# Patient Record
Sex: Female | Born: 1965 | Race: Black or African American | Hispanic: No | Marital: Single | Smoking: Never smoker
Health system: Southern US, Community
[De-identification: ages and names within clinical notes are randomized; demographics above are authoritative.]

## PROBLEM LIST (undated history)

## (undated) DIAGNOSIS — G43909 Migraine, unspecified, not intractable, without status migrainosus: Secondary | ICD-10-CM

## (undated) DIAGNOSIS — G5600 Carpal tunnel syndrome, unspecified upper limb: Secondary | ICD-10-CM

## (undated) DIAGNOSIS — K219 Gastro-esophageal reflux disease without esophagitis: Secondary | ICD-10-CM

## (undated) HISTORY — PX: ABDOMINAL HYSTERECTOMY: SHX81

## (undated) HISTORY — DX: Carpal tunnel syndrome, unspecified upper limb: G56.00

---

## 2010-08-09 ENCOUNTER — Encounter: Payer: Self-pay | Admitting: Emergency Medicine

## 2010-08-09 ENCOUNTER — Emergency Department (HOSPITAL_COMMUNITY)
Admission: EM | Admit: 2010-08-09 | Discharge: 2010-08-09 | Disposition: A | Discharge status: Home / Self Care | Payer: Self-pay | Attending: Emergency Medicine | Admitting: Emergency Medicine

## 2010-08-09 DIAGNOSIS — R21 Rash and other nonspecific skin eruption: Secondary | ICD-10-CM

## 2010-08-09 MED ORDER — TRIAMCINOLONE ACETONIDE 0.1 % EX CREA: TOPICAL_CREAM | Freq: Two times a day (BID) | CUTANEOUS | Status: AC

## 2010-08-09 MED ORDER — PREDNISONE 10 MG PO TABS: ORAL_TABLET | ORAL | Status: DC

## 2010-08-09 MED ORDER — TRIAMCINOLONE ACETONIDE 0.1 % EX CREA: TOPICAL_CREAM | Freq: Two times a day (BID) | CUTANEOUS | Status: DC

## 2010-08-09 NOTE — ED Notes (Signed)
Pt states the rash started around her mouth and now has spread to her neck and back.

## 2010-08-09 NOTE — Discharge Instructions (Signed)
If rash not improving in 5 to 7 days, please see Dr Margo Aye (Dermatologist) for evaluation.Rash-Generic Many things can cause a rash. We are not certain what is causing the rash that you have. Some causes include infection, allergic reactions, medications, and chemicals. Sometimes something in your home that comes in contact with your skin may cause the rash. These include pets, new soaps, cosmetics, and foods. HOME CARE INSTRUCTIONS  Avoid extreme heat or cold, unless otherwise instructed. This can make the itching worse.   A cool bath or shower or a cool washcloth can sometimes ease the itching.   Avoid scratching. This can cause infection.   Take those medications prescribed by your caregiver.  SEEK IMMEDIATE MEDICAL ATTENTION IF:  You develop increasing pain, swelling, or redness.   You develop a fever.   You develop new or severe symptoms such as body aches and pains, diarrhea, vomiting.   Your rash is not better in 3 days.  Document Released: 12/28/2001 Document Re-Released: 04/05/2008 [facility] Patient Information 2011 [place], [place].

## 2010-08-09 NOTE — ED Notes (Signed)
Awaiting pharmacy to bring medication.

## 2010-08-09 NOTE — ED Provider Notes (Signed)
History     Chief Complaint  Patient presents with  . Rash  . Headache   Patient is a 45 y.o. female presenting with rash. The history is provided by the patient.  Rash  This is a new problem. The current episode started more than 2 days ago. The problem has been gradually worsening. The problem is associated with nothing. There has been no fever. The rash is present on the face and neck. The patient is experiencing no pain. Pertinent negatives include no blisters and no weeping. She has tried nothing for the symptoms.    History reviewed. No pertinent past medical history.  History reviewed. No pertinent past surgical history.  Family History  Problem Relation Age of Onset  . Diabetes Mother     History  Substance Use Topics  . Smoking status: Never Smoker   . Smokeless tobacco: Not on file  . Alcohol Use: No    OB History    Grav Para Term Preterm Abortions TAB SAB Ect Mult Living   2 2 2       2       Review of Systems  Constitutional: Negative for activity change.       All ROS Neg except as noted in HPI  HENT: Negative for nosebleeds and neck pain.   Eyes: Negative for photophobia and discharge.  Respiratory: Negative for cough, shortness of breath and wheezing.   Cardiovascular: Negative for chest pain and palpitations.  Gastrointestinal: Negative for abdominal pain and blood in stool.  Genitourinary: Negative for dysuria, frequency and hematuria.  Musculoskeletal: Negative for back pain and arthralgias.  Skin: Positive for rash.       Fine macular rash around the scalp line, mouth, chin, left neck and left shoulder. No oral or palm involvement.  Neurological: Negative for dizziness, seizures and speech difficulty.  Psychiatric/Behavioral: Negative for hallucinations and confusion.    Physical Exam  BP 108/73  Pulse 64  Temp(Src) 98.7 F (37.1 C) (Oral)  Resp 21  Ht 5\' 6"  (1.676 m)  Wt 160 lb (72.576 kg)  BMI 25.82 kg/m2  SpO2 100%  Physical Exam    Nursing note and vitals reviewed. Constitutional: She is oriented to person, place, and time. She appears well-developed and well-nourished.  Non-toxic appearance.  HENT:  Head: Normocephalic.  Right Ear: Tympanic membrane and external ear normal.  Left Ear: Tympanic membrane and external ear normal.  Eyes: EOM and lids are normal. Pupils are equal, round, and reactive to light.  Neck: Normal range of motion. Neck supple. Carotid bruit is not present.  Cardiovascular: Normal rate, regular rhythm, normal heart sounds, intact distal pulses and normal pulses.   Pulmonary/Chest: Breath sounds normal. No respiratory distress.  Abdominal: Soft. Bowel sounds are normal. There is no tenderness. There is no guarding.  Musculoskeletal: Normal range of motion.  Lymphadenopathy:       Head (right side): No submandibular adenopathy present.       Head (left side): No submandibular adenopathy present.    She has no cervical adenopathy.  Neurological: She is alert and oriented to person, place, and time. She has normal strength. No cranial nerve deficit or sensory deficit.  Skin: Skin is warm and dry. Rash noted.       Fine macular rash of the forehead at the scalp line, around the mouth, chin, left neck and shoulder.  Psychiatric: She has a normal mood and affect. Her speech is normal.    ED Course  Procedures  MDM I have reviewed nursing notes, vital signs, and all appropriate lab and imaging results for this patient.      Kathie Dike, [place] 08/09/10 1053

## 2010-08-17 DIAGNOSIS — R079 Chest pain, unspecified: Secondary | ICD-10-CM

## 2010-09-12 NOTE — ED Provider Notes (Signed)
Medical screening examination/treatment/procedure(s) were performed by non-physician practitioner and as supervising physician I was immediately available for consultation/collaboration.   Benny Lennert, MD 09/12/10 [id number]

## 2011-08-09 ENCOUNTER — Encounter (HOSPITAL_COMMUNITY): Payer: Self-pay | Admitting: Emergency Medicine

## 2011-08-09 ENCOUNTER — Emergency Department (HOSPITAL_COMMUNITY)
Admission: EM | Admit: 2011-08-09 | Discharge: 2011-08-09 | Disposition: A | Discharge status: Home / Self Care | Payer: Self-pay | Attending: Emergency Medicine | Admitting: Emergency Medicine

## 2011-08-09 DIAGNOSIS — M542 Cervicalgia: Secondary | ICD-10-CM | POA: Insufficient documentation

## 2011-08-09 HISTORY — DX: Migraine, unspecified, not intractable, without status migrainosus: G43.909

## 2011-08-09 MED ORDER — HYDROCODONE-ACETAMINOPHEN 5-325 MG PO TABS: 1.0000 | ORAL_TABLET | Freq: Four times a day (QID) | ORAL | Status: AC | PRN

## 2011-08-09 MED ORDER — NAPROXEN 500 MG PO TABS: 500.0000 mg | ORAL_TABLET | Freq: Two times a day (BID) | ORAL | Status: DC

## 2011-08-09 MED ORDER — CYCLOBENZAPRINE HCL 10 MG PO TABS: 10.0000 mg | ORAL_TABLET | Freq: Two times a day (BID) | ORAL | Status: AC | PRN

## 2011-08-09 NOTE — ED Notes (Signed)
Patient complaining of sharp pains going down both arms, states that pain shoots through back across shoulders. Also reports numbness and pain that goes down to both hands. States has been bothering her for a few days.

## 2011-08-09 NOTE — ED Notes (Signed)
Patient also reports that she has been getting intermittent pains in her chest. No chest pain at this time, only shoulder pain.

## 2011-08-09 NOTE — ED Provider Notes (Addendum)
History  This chart was scribed for Sara Jakes, MD by Greer Ee. This patient was seen in room APA14/APA14 and the patient's care was started at 7:07AM.  CSN: [id number]  Arrival date & time 08/09/11  7829   First MD Initiated Contact with Patient 08/09/11 [id number]      Chief Complaint  Patient presents with  . Arm Pain     Patient is a 46 y.o. female presenting with arm pain. The history is provided by the patient. No language interpreter was used.  Arm Pain Pertinent negatives include no chest pain, no abdominal pain and no shortness of breath.   Sara Washington is a 46 y.o. female who presents to the Emergency Department complaining of one week of bilateral arm pain described as sharp and worse on the right side.  Pt reports that pain radiates shooting pains to neck and both shoulders with associated numbness in bilateral arms. Pt denies numbness in bilateral hands and fingers and denies any lower extremity symptoms.  Pt reports that current pain as sharp, waxing/waning and as a 5 or 6 out of 10.  Pt has taken Aleve to reduce pain.  She also c/o intermittent CP. She denies CP currently. She has a h/o migraine. She does not have a h/o chronic medical conditions.     Past Medical History  Diagnosis Date  . Migraine     Past Surgical History  Procedure Date  . Cesarean section   . Abdominal hysterectomy     Family History  Problem Relation Age of Onset  . Diabetes Mother     History  Substance Use Topics  . Smoking status: Never Smoker   . Smokeless tobacco: Not on file  . Alcohol Use: No    OB History    Grav Para Term Preterm Abortions TAB SAB Ect Mult Living   2 2 2       2       Review of Systems  Constitutional: Negative for fever and chills.  HENT: Negative for congestion, sore throat and neck pain.   Eyes: Negative for visual disturbance.  Respiratory: Negative for cough and shortness of breath.   Cardiovascular: Negative for chest pain.    Gastrointestinal: Negative for nausea, vomiting, abdominal pain and diarrhea.  Genitourinary: Negative for dysuria.  Musculoskeletal: Negative for back pain.       Bilateral arm and shoulder pain  Skin: Negative for rash.  Neurological: Positive for numbness (Bilateral arms, no numbness is fingers or hands.).  Hematological: Does not bruise/bleed easily.  Psychiatric/Behavioral: Negative for confusion.    Allergies  Review of patient's allergies indicates no known allergies.  Home Medications   Current Outpatient Rx  Name Route Sig Dispense Refill  . CYCLOBENZAPRINE HCL 10 MG PO TABS Oral Take 1 tablet (10 mg total) by mouth 2 (two) times daily as needed for muscle spasms. 20 tablet 0  . HYDROCODONE-ACETAMINOPHEN 5-325 MG PO TABS Oral Take 1-2 tablets by mouth every 6 (six) hours as needed for pain. 10 tablet 0  . NAPROXEN 500 MG PO TABS Oral Take 1 tablet (500 mg total) by mouth 2 (two) times daily. 14 tablet 0  . PREDNISONE 10 MG PO TABS  6,5,4,3,2,1 - take with food 21 tablet 0  . TRIAMCINOLONE ACETONIDE 0.1 % EX CREA Topical Apply topically 2 (two) times daily. 15 g 0    Triage Vitals: BP 137/78  Pulse 75  Temp 98.2 F (36.8 C) (Oral)  Resp 18  Ht 5'  6" (1.676 m)  Wt 160 lb (72.576 kg)  BMI 25.82 kg/m2  SpO2 97%  Physical Exam  Nursing note and vitals reviewed. Constitutional: She is oriented to person, place, and time. She appears well-developed and well-nourished. No distress.  HENT:  Head: Normocephalic and atraumatic.  Mouth/Throat: Oropharynx is clear and moist.  Eyes: Conjunctivae and EOM are normal.  Neck: Neck supple. No tracheal deviation present.  Cardiovascular: Normal rate, regular rhythm and normal heart sounds.   No murmur heard.      Radial pulse 1+ in right arm.  Pulmonary/Chest: Effort normal and breath sounds normal. No respiratory distress.  Abdominal: Soft. Bowel sounds are normal. There is no tenderness.  Musculoskeletal: Normal range of  motion. She exhibits no edema.  Lymphadenopathy:    She has no cervical adenopathy.  Neurological: She is alert and oriented to person, place, and time.  Skin: Skin is warm and dry.  Psychiatric: She has a normal mood and affect. Her behavior is normal.    ED Course  Procedures (including critical care time)  DIAGNOSTIC STUDIES: Oxygen Saturation is 97% on room air, adequate by my interpretation.    COORDINATION OF CARE: AM-Discussed treatment plan with pt at bedside and pt agreed to plan.  Date: 08/09/2011  Rate: 72  Rhythm: normal sinus rhythm  QRS Axis: normal  Intervals: normal  ST/T Wave abnormalities: nonspecific ST changes  Conduction Disutrbances:none  Narrative Interpretation:   Old EKG Reviewed: none available    Labs Reviewed - No data to display No results found.   1. Neck pain       MDM  Symptoms most consistent with cervical neck pain no clearcut radiculopathy at this point in time but does have pain radiating into the right arm predominately but no numbness to the fingers or weakness to the hand. Have referred patient to the spine specialist for followup of treat with anti-inflammatories muscle relaxers and pain medicine and a work note. No history of injury.      I personally performed the services described in this documentation, which was scribed in my presence. The recorded information has been reviewed and considered.     Sara Jakes, MD 08/09/11 [id number] a  Sara Jakes, MD 08/13/11 2203

## 2012-05-28 ENCOUNTER — Emergency Department (HOSPITAL_COMMUNITY)
Admission: EM | Admit: 2012-05-28 | Discharge: 2012-05-28 | Disposition: A | Discharge status: Home / Self Care | Payer: Self-pay | Attending: Emergency Medicine | Admitting: Emergency Medicine

## 2012-05-28 ENCOUNTER — Encounter (HOSPITAL_COMMUNITY): Payer: Self-pay | Admitting: Emergency Medicine

## 2012-05-28 DIAGNOSIS — L03039 Cellulitis of unspecified toe: Secondary | ICD-10-CM | POA: Insufficient documentation

## 2012-05-28 DIAGNOSIS — L03031 Cellulitis of right toe: Secondary | ICD-10-CM

## 2012-05-28 DIAGNOSIS — L02619 Cutaneous abscess of unspecified foot: Secondary | ICD-10-CM | POA: Insufficient documentation

## 2012-05-28 DIAGNOSIS — Z8679 Personal history of other diseases of the circulatory system: Secondary | ICD-10-CM | POA: Insufficient documentation

## 2012-05-28 MED ORDER — OXYCODONE-ACETAMINOPHEN 5-325 MG PO TABS: 1.0000 | ORAL_TABLET | ORAL | Status: DC | PRN

## 2012-05-28 MED ORDER — CEPHALEXIN 500 MG PO CAPS
500.0000 mg | ORAL_CAPSULE | Freq: Once | ORAL | Status: AC
  Administered 2012-05-28: 500 mg via ORAL
  Filled 2012-05-28: qty 1

## 2012-05-28 MED ORDER — CEPHALEXIN 500 MG PO CAPS: 500.0000 mg | ORAL_CAPSULE | Freq: Four times a day (QID) | ORAL | Status: DC

## 2012-05-28 NOTE — ED Notes (Signed)
Pt c/o rt great toe pain x one week.

## 2012-05-28 NOTE — ED Notes (Signed)
Pt states noticed a bump on her left great toe. Pt states putting callus remover on it but has continued to swell & hurting. Medial side of toe covered w/ white callus removed.

## 2012-05-28 NOTE — ED Provider Notes (Signed)
History     CSN: [id number]  Arrival date & time 05/28/12  1905   First MD Initiated Contact with Patient 05/28/12 2047      Chief Complaint  Patient presents with  . Foot Pain  . Wound Check    (Consider location/radiation/quality/duration/timing/severity/associated sxs/prior treatment) Patient is a 47 y.o. female presenting with lower extremity pain and wound check. The history is provided by the patient.  Foot Pain  Wound Check  She noted onset about 4 days ago of pain in her right first toe. She applied some white cream to it and the underlying skin started to turn white. The toe continues to be very painful. She related it to wearing steel toe shoes but states she does wear 2 pairs socks. Pain is moderate to severe and she rates it an 8/10. It is worse with palpation worse with walking. Denies fever, chills, sweats. She has noted some swelling of the toe. She's been taking over-the-counter naproxen without relief the  Past Medical History  Diagnosis Date  . Migraine     Past Surgical History  Procedure Laterality Date  . Cesarean section    . Abdominal hysterectomy      Family History  Problem Relation Age of Onset  . Diabetes Mother     History  Substance Use Topics  . Smoking status: Never Smoker   . Smokeless tobacco: Not on file  . Alcohol Use: No    OB History   Grav Para Term Preterm Abortions TAB SAB Ect Mult Living   2 2 2       2       Review of Systems  All other systems reviewed and are negative.    Allergies  Review of patient's allergies indicates no known allergies.  Home Medications   Current Outpatient Rx  Name  Route  Sig  Dispense  Refill  . Salicylic Acid (CALLUS REMOVERS) 40 % PADS   Apply externally   Apply 1 application topically as needed (for removal).         . Salicylic Acid (CORN/CALLUS REMOVER) 12.6 % SOLN   Apply externally   Apply 1 application topically as needed (for removal).           BP 126/78  Pulse  87  Temp(Src) 97.1 F (36.2 C) (Oral)  Resp 16  Ht 5\' 6"  (1.676 m)  Wt 150 lb (68.04 kg)  BMI 24.22 kg/m2  SpO2 97%  Physical Exam  Nursing note and vitals reviewed.  47 year old female, resting comfortably and in no acute distress. Vital signs are normal. Oxygen saturation is 97%, which is normal. Head is normocephalic and atraumatic. PERRLA, EOMI. Oropharynx is clear. Neck is nontender and supple without adenopathy or JVD. Back is nontender and there is no CVA tenderness. Lungs are clear without rales, wheezes, or rhonchi. Chest is nontender. Heart has regular rate and rhythm without murmur. Abdomen is soft, flat, nontender without masses or hepatosplenomegaly and peristalsis is normoactive. Extremities:. There is a blow on the medial aspect of the right first toe with surrounding erythema. There is no evidence of any purulence in the boil. There no lymphangitic streaks present. This is moderately tender to palpation. Overall picture is that of a low-grade cellulitis. Skin is warm and dry without rash. Neurologic: Mental status is normal, cranial nerves are intact, there are no motor or sensory deficits.  ED Course  Procedures (including critical care time)   1. Cellulitis of great toe of right foot  MDM  Of the right first toe with surrounding cellulitis. She is sent home with prescriptions for cephalexin and Percocet. She is to continue taking naproxen.        Dione Booze, MD 05/28/12 2113

## 2012-06-01 MED FILL — Oxycodone w/ Acetaminophen Tab 5-325 MG: ORAL | Qty: 6 | Status: AC

## 2013-10-04 ENCOUNTER — Emergency Department (HOSPITAL_COMMUNITY)
Admission: EM | Admit: 2013-10-04 | Discharge: 2013-10-04 | Disposition: A | Discharge status: Home / Self Care | Payer: Self-pay | Attending: Emergency Medicine | Admitting: Emergency Medicine

## 2013-10-04 ENCOUNTER — Encounter (HOSPITAL_COMMUNITY): Payer: Self-pay | Admitting: Emergency Medicine

## 2013-10-04 ENCOUNTER — Emergency Department (HOSPITAL_COMMUNITY): Payer: Self-pay

## 2013-10-04 DIAGNOSIS — Z8679 Personal history of other diseases of the circulatory system: Secondary | ICD-10-CM | POA: Insufficient documentation

## 2013-10-04 DIAGNOSIS — N10 Acute tubulo-interstitial nephritis: Secondary | ICD-10-CM | POA: Insufficient documentation

## 2013-10-04 DIAGNOSIS — R1032 Left lower quadrant pain: Secondary | ICD-10-CM | POA: Insufficient documentation

## 2013-10-04 DIAGNOSIS — Z90710 Acquired absence of both cervix and uterus: Secondary | ICD-10-CM | POA: Insufficient documentation

## 2013-10-04 DIAGNOSIS — Z9889 Other specified postprocedural states: Secondary | ICD-10-CM | POA: Insufficient documentation

## 2013-10-04 LAB — COMPREHENSIVE METABOLIC PANEL
ALT: 11 U/L (ref 0–35)
AST: 17 U/L (ref 0–37)
Albumin: 4.4 g/dL (ref 3.5–5.2)
Alkaline Phosphatase: 75 U/L (ref 39–117)
Anion gap: 11 (ref 5–15)
BILIRUBIN TOTAL: 0.5 mg/dL (ref 0.3–1.2)
BUN: 20 mg/dL (ref 6–23)
CHLORIDE: 104 meq/L (ref 96–112)
CO2: 27 meq/L (ref 19–32)
CREATININE: 0.96 mg/dL (ref 0.50–1.10)
Calcium: 9.1 mg/dL (ref 8.4–10.5)
GFR calc Af Amer: 79 mL/min — ABNORMAL LOW (ref >90)
GFR, EST NON AFRICAN AMERICAN: 68 mL/min — AB (ref >90)
Glucose, Bld: 91 mg/dL (ref 70–99)
Potassium: 4 mEq/L (ref 3.7–5.3)
Sodium: 142 mEq/L (ref 137–147)
Total Protein: 7.1 g/dL (ref 6.0–8.3)

## 2013-10-04 LAB — CBC WITH DIFFERENTIAL/PLATELET
BASOS ABS: 0 10*3/uL (ref 0.0–0.1)
Basophils Relative: 0 % (ref 0–1)
Eosinophils Absolute: 0.1 10*3/uL (ref 0.0–0.7)
Eosinophils Relative: 2 % (ref 0–5)
HEMATOCRIT: 37.4 % (ref 36.0–46.0)
HEMOGLOBIN: 12.1 g/dL (ref 12.0–15.0)
LYMPHS PCT: 44 % (ref 12–46)
Lymphs Abs: 2 10*3/uL (ref 0.7–4.0)
MCH: 27.7 pg (ref 26.0–34.0)
MCHC: 32.4 g/dL (ref 30.0–36.0)
MCV: 85.6 fL (ref 78.0–100.0)
MONO ABS: 0.3 10*3/uL (ref 0.1–1.0)
MONOS PCT: 6 % (ref 3–12)
NEUTROS ABS: 2.2 10*3/uL (ref 1.7–7.7)
Neutrophils Relative %: 48 % (ref 43–77)
Platelets: 269 10*3/uL (ref 150–400)
RBC: 4.37 MIL/uL (ref 3.87–5.11)
RDW: 12 % (ref 11.5–15.5)
WBC: 4.6 10*3/uL (ref 4.0–10.5)

## 2013-10-04 LAB — URINALYSIS, ROUTINE W REFLEX MICROSCOPIC
Bilirubin Urine: NEGATIVE
GLUCOSE, UA: NEGATIVE mg/dL
KETONES UR: NEGATIVE mg/dL
LEUKOCYTES UA: NEGATIVE
Nitrite: NEGATIVE
PROTEIN: NEGATIVE mg/dL
Specific Gravity, Urine: 1.025 (ref 1.005–1.030)
Urobilinogen, UA: 2 mg/dL — ABNORMAL HIGH (ref 0.0–1.0)
pH: 5.5 (ref 5.0–8.0)

## 2013-10-04 LAB — URINE MICROSCOPIC-ADD ON

## 2013-10-04 MED ORDER — CEPHALEXIN 500 MG PO CAPS: ORAL_CAPSULE | ORAL | Status: DC

## 2013-10-04 MED ORDER — FENTANYL CITRATE 0.05 MG/ML IJ SOLN
50.0000 ug | Freq: Once | INTRAMUSCULAR | Status: AC
  Administered 2013-10-04: 50 ug via INTRAVENOUS
  Filled 2013-10-04: qty 2

## 2013-10-04 MED ORDER — ONDANSETRON 8 MG PO TBDP: ORAL_TABLET | ORAL | Status: DC

## 2013-10-04 MED ORDER — OXYCODONE-ACETAMINOPHEN 5-325 MG PO TABS: 2.0000 | ORAL_TABLET | Freq: Four times a day (QID) | ORAL | Status: DC | PRN

## 2013-10-04 NOTE — Discharge Instructions (Signed)
Pyelonephritis, Adult Pyelonephritis is a kidney infection. A kidney infection can happen quickly, or it can last for a long time. HOME CARE   Take your medicine (antibiotics) as told. Finish it even if you start to feel better.  Keep all doctor visits as told.  Drink enough fluids to keep your pee (urine) clear or pale yellow.  Only take medicine as told by your doctor. GET HELP RIGHT AWAY IF:   You have a fever or lasting symptoms for more than 2-3 days.  You have a fever and your symptoms suddenly get worse.  You cannot take your medicine or drink fluids as told.  You have chills and shaking.  You feel very weak or pass out (faint).  You do not feel better after 2 days or have other concerns. MAKE SURE YOU:  Understand these instructions.  Will watch your condition.  Will get help right away if you are not doing well or get worse. Document Released: 02/15/2004 Document Revised: 07/09/2011 Document Reviewed: 06/27/2010 [facility] Patient Information 2015 [place], [place]. This information is not intended to replace advice given to you by your health care provider. Make sure you discuss any questions you have with your health care provider.

## 2013-10-04 NOTE — ED Provider Notes (Signed)
CSN: [id number]     Arrival date & time 10/04/13  1454 History   First MD Initiated Contact with Patient 10/04/13 1604     This chart was scribed for Sara Relic, MD by Sara Washington, ED Scribe. This patient was seen in room APA19/APA19 and the patient's care was started at 4:04 PM.   Chief Complaint  Patient presents with  . Flank Pain   The history is provided by the patient. No language interpreter was used.    HPI Comments: Sara Washington is a 48 y.o. female with a prior of C-section and hysterectomy who presents to the Emergency Department complaining of sharp left abdominal and flank pain with onset a few weeks ago. She states it has been intermittent for a few hours at a time for the past few weeks but states it has been constant today. She states she sometimes feel as if she cannot get comfortable and other times feels as if she can't move. She denies history of kidney stones or diverticulitis. She denies weakness or numbness in legs, pain radiating down legs, or change to bowel or bladder function  Past Medical History  Diagnosis Date  . Migraine    Past Surgical History  Procedure Laterality Date  . Cesarean section    . Abdominal hysterectomy     Family History  Problem Relation Age of Onset  . Diabetes Mother    History  Substance Use Topics  . Smoking status: Never Smoker   . Smokeless tobacco: Not on file  . Alcohol Use: No   OB History   Grav Para Term Preterm Abortions TAB SAB Ect Mult Living   2 2 2       2      Review of Systems 10 Systems reviewed and are negative for acute change except as noted in the HPI.  Allergies  Review of patient's allergies indicates no known allergies.  Home Medications   Prior to Admission medications   Medication Sig Start Date End Date Taking? Authorizing Provider  cephALEXin (KEFLEX) 500 MG capsule 2 caps po bid x 7 days 10/04/13   Sara Relic, MD  ondansetron (ZOFRAN ODT) 8 MG disintegrating tablet 8mg  ODT q4 hours prn  nausea 10/04/13   Sara Relic, MD  oxyCODONE-acetaminophen (PERCOCET) 5-325 MG per tablet Take 2 tablets by mouth every 6 (six) hours as needed for severe pain. 10/04/13   Sara Relic, MD   BP 114/81  Pulse 67  Temp(Src) 99.4 F (37.4 C) (Oral)  Resp 20  Ht 5\' 5"  (1.651 m)  Wt 150 lb (68.04 kg)  BMI 24.96 kg/m2  SpO2 99% Physical Exam  Nursing note and vitals reviewed. Constitutional:  Awake, alert, nontoxic appearance.  HENT:  Head: Atraumatic.  Eyes: Right eye exhibits no discharge. Left eye exhibits no discharge.  Neck: Neck supple.  Pulmonary/Chest: Effort normal. She exhibits no tenderness.  Abdominal: Soft. Bowel sounds are normal. There is tenderness (mild left lower tenderness, rest nont\ender). There is no rebound.  Genitourinary:  No CVA tenderness, positive mild left paralumbar tenderness without midline tenderness without rash  Musculoskeletal: She exhibits no tenderness.  Baseline ROM, no obvious new focal weakness.  Neurological:  Mental status and motor strength appears baseline for patient and situation.  Skin: No rash noted.  Psychiatric: She has a normal mood and affect.    ED Course  Procedures (including critical care time) Patient informed of clinical course, understand medical decision-making process, and agree with plan. Labs  Review Labs Reviewed  COMPREHENSIVE METABOLIC PANEL - Abnormal; Notable for the following:    GFR calc non Af Amer 68 (*)    GFR calc Af Amer 79 (*)    All other components within normal limits  URINALYSIS, ROUTINE W REFLEX MICROSCOPIC - Abnormal; Notable for the following:    Hgb urine dipstick SMALL (*)    Urobilinogen, UA 2.0 (*)    All other components within normal limits  URINE MICROSCOPIC-ADD ON - Abnormal; Notable for the following:    Squamous Epithelial / LPF FEW (*)    Bacteria, UA FEW (*)    All other components within normal limits  URINE CULTURE  CBC WITH DIFFERENTIAL    Imaging Review No results  found.   EKG Interpretation None     Patient / Family / Caregiver understand and agree with initial ED impression and plan with expectations set for ED visit.  6:49 PM Pt feels improved after observation and/or treatment in ED.   MDM   Final diagnoses:  Pyelonephritis, acute   I doubt any other EMC precluding discharge at this time including, but not necessarily limited to the following:diverticulitis, renal stone.   I personally performed the services described in this documentation, which was scribed in my presence. The recorded information has been reviewed and is accurate.      Sara Relic, MD 10/06/13 [phone]

## 2013-10-04 NOTE — ED Notes (Signed)
Lt flank pain intermittent for 2 weeks., cramping in legs.

## 2013-10-06 LAB — URINE CULTURE
COLONY COUNT: NO GROWTH
CULTURE: NO GROWTH

## 2013-11-22 ENCOUNTER — Encounter (HOSPITAL_COMMUNITY): Payer: Self-pay | Admitting: Emergency Medicine

## 2013-12-06 ENCOUNTER — Encounter (HOSPITAL_COMMUNITY): Payer: Self-pay

## 2013-12-06 ENCOUNTER — Emergency Department (HOSPITAL_COMMUNITY): Payer: Self-pay

## 2013-12-06 ENCOUNTER — Emergency Department (HOSPITAL_COMMUNITY)
Admission: EM | Admit: 2013-12-06 | Discharge: 2013-12-06 | Disposition: A | Discharge status: Home / Self Care | Payer: Self-pay | Attending: Emergency Medicine | Admitting: Emergency Medicine

## 2013-12-06 DIAGNOSIS — R200 Anesthesia of skin: Secondary | ICD-10-CM | POA: Insufficient documentation

## 2013-12-06 DIAGNOSIS — R0789 Other chest pain: Secondary | ICD-10-CM | POA: Insufficient documentation

## 2013-12-06 DIAGNOSIS — H53149 Visual discomfort, unspecified: Secondary | ICD-10-CM | POA: Insufficient documentation

## 2013-12-06 DIAGNOSIS — G43909 Migraine, unspecified, not intractable, without status migrainosus: Secondary | ICD-10-CM | POA: Insufficient documentation

## 2013-12-06 DIAGNOSIS — R51 Headache: Secondary | ICD-10-CM

## 2013-12-06 DIAGNOSIS — R519 Headache, unspecified: Secondary | ICD-10-CM

## 2013-12-06 DIAGNOSIS — Z79891 Long term (current) use of opiate analgesic: Secondary | ICD-10-CM | POA: Insufficient documentation

## 2013-12-06 LAB — CBC
HCT: 36.8 % (ref 36.0–46.0)
Hemoglobin: 11.8 g/dL — ABNORMAL LOW (ref 12.0–15.0)
MCH: 27.7 pg (ref 26.0–34.0)
MCHC: 32.1 g/dL (ref 30.0–36.0)
MCV: 86.4 fL (ref 78.0–100.0)
PLATELETS: 246 10*3/uL (ref 150–400)
RBC: 4.26 MIL/uL (ref 3.87–5.11)
RDW: 12.3 % (ref 11.5–15.5)
WBC: 5 10*3/uL (ref 4.0–10.5)

## 2013-12-06 LAB — BASIC METABOLIC PANEL
ANION GAP: 13 (ref 5–15)
BUN: 15 mg/dL (ref 6–23)
CALCIUM: 9.2 mg/dL (ref 8.4–10.5)
CO2: 26 mEq/L (ref 19–32)
CREATININE: 0.98 mg/dL (ref 0.50–1.10)
Chloride: 105 mEq/L (ref 96–112)
GFR, EST AFRICAN AMERICAN: 77 mL/min — AB (ref >90)
GFR, EST NON AFRICAN AMERICAN: 66 mL/min — AB (ref >90)
Glucose, Bld: 117 mg/dL — ABNORMAL HIGH (ref 70–99)
Potassium: 3.5 mEq/L — ABNORMAL LOW (ref 3.7–5.3)
Sodium: 144 mEq/L (ref 137–147)

## 2013-12-06 LAB — PROTIME-INR
INR: 1.06 (ref 0.00–1.49)
PROTHROMBIN TIME: 13.9 s (ref 11.6–15.2)

## 2013-12-06 LAB — TROPONIN I: Troponin I: <0.3 ng/mL (ref <0.30)

## 2013-12-06 MED ORDER — SODIUM CHLORIDE 0.9 % IV SOLN
1000.0000 mL | Freq: Once | INTRAVENOUS | Status: AC
  Administered 2013-12-06: 1000 mL via INTRAVENOUS

## 2013-12-06 MED ORDER — METOCLOPRAMIDE HCL 10 MG PO TABS: 10.0000 mg | ORAL_TABLET | Freq: Four times a day (QID) | ORAL | Status: DC | PRN

## 2013-12-06 MED ORDER — SODIUM CHLORIDE 0.9 % IV SOLN
1000.0000 mL | INTRAVENOUS | Status: DC
  Administered 2013-12-06: 1000 mL via INTRAVENOUS

## 2013-12-06 MED ORDER — KETOROLAC TROMETHAMINE 30 MG/ML IJ SOLN
30.0000 mg | Freq: Once | INTRAMUSCULAR | Status: AC
  Administered 2013-12-06: 30 mg via INTRAVENOUS
  Filled 2013-12-06: qty 1

## 2013-12-06 MED ORDER — DEXAMETHASONE SODIUM PHOSPHATE 10 MG/ML IJ SOLN
10.0000 mg | Freq: Once | INTRAMUSCULAR | Status: AC
  Administered 2013-12-06: 10 mg via INTRAVENOUS
  Filled 2013-12-06: qty 1

## 2013-12-06 MED ORDER — DIPHENHYDRAMINE HCL 50 MG/ML IJ SOLN
25.0000 mg | Freq: Once | INTRAMUSCULAR | Status: AC
  Administered 2013-12-06: 25 mg via INTRAVENOUS
  Filled 2013-12-06: qty 1

## 2013-12-06 MED ORDER — METOCLOPRAMIDE HCL 5 MG/ML IJ SOLN
10.0000 mg | Freq: Once | INTRAMUSCULAR | Status: AC
  Administered 2013-12-06: 10 mg via INTRAVENOUS
  Filled 2013-12-06: qty 2

## 2013-12-06 NOTE — ED Provider Notes (Signed)
CSN: [id number]     Arrival date & time 12/06/13  0119 History   First MD Initiated Contact with Patient 12/06/13 0136     Chief Complaint  Patient presents with  . Chest Pain  . Headache     (Consider location/radiation/quality/duration/timing/severity/associated sxs/prior Treatment) Patient is a 48 y.o. female presenting with chest pain and headaches. The history is provided by the patient.  Chest Pain Associated symptoms: headache   Headache She history separate complaints. She has a left frontal headache which started at 10 AM. This headache is a throbbing pain which she states is typical for her migraines. She rates pain at 9/10. There is associated photophobia. She denies visual disturbance, neck stiffness, fever, weakness, numbness, tingling. She took over-the-counter naproxen with no relief. Her second complaint is right-sided chest pain which she describes as a sharp pain which is worse with movement. Pain comes and goes and will last about 30-40 minutes when present. There is some associated numbness of the right arm. She denies dyspnea, nausea, diaphoresis. She denies any trauma. There's been no fever or chills. She is not taken anything for this pain. She rates pain at 9/10 when present.  Past Medical History  Diagnosis Date  . Migraine    Past Surgical History  Procedure Laterality Date  . Cesarean section    . Abdominal hysterectomy     Family History  Problem Relation Age of Onset  . Diabetes Mother    History  Substance Use Topics  . Smoking status: Never Smoker   . Smokeless tobacco: Not on file  . Alcohol Use: No   OB History    Gravida Para Term Preterm AB TAB SAB Ectopic Multiple Living   2 2 2       2      Review of Systems  Cardiovascular: Positive for chest pain.  Neurological: Positive for headaches.  All other systems reviewed and are negative.     Allergies  Review of patient's allergies indicates no known allergies.  Home Medications    Prior to Admission medications   Medication Sig Start Date End Date Taking? Authorizing Provider  cephALEXin (KEFLEX) 500 MG capsule 2 caps po bid x 7 days 10/04/13   Babette Relic, MD  ondansetron (ZOFRAN ODT) 8 MG disintegrating tablet 8mg  ODT q4 hours prn nausea 10/04/13   Babette Relic, MD  oxyCODONE-acetaminophen (PERCOCET) 5-325 MG per tablet Take 2 tablets by mouth every 6 (six) hours as needed for severe pain. 10/04/13   Babette Relic, MD   BP 122/82 mmHg  Pulse 77  Temp(Src) 97.9 F (36.6 C) (Oral)  Resp 16  Ht 5\' 6"  (1.676 m)  Wt 150 lb (68.04 kg)  BMI 24.22 kg/m2  SpO2 99% Physical Exam  Nursing note and vitals reviewed.  48 year old female, resting comfortably and in no acute distress. Vital signs are normal. Oxygen saturation is 99%, which is normal. Head is normocephalic and atraumatic. PERRLA, EOMI. Oropharynx is clear. Fundi show no hemorrhage, exudate, or papilledema. Neck is nontender and supple without adenopathy or JVD. Back is nontender and there is no CVA tenderness. Lungs are clear without rales, wheezes, or rhonchi. Chest is moderately tender over the right upper anterior chest wall. This does reproduce her pain. Heart has regular rate and rhythm without murmur. Abdomen is soft, flat, nontender without masses or hepatosplenomegaly and peristalsis is normoactive. Extremities have no cyanosis or edema, full range of motion is present. Skin is warm and dry without  rash. Neurologic: Mental status is normal, cranial nerves are intact, there are no motor or sensory deficits.  ED Course  Procedures (including critical care time) Labs Review Results for orders placed or performed during the hospital encounter of 12/06/13  CBC  Result Value Ref Range   WBC 5.0 4.0 - 10.5 K/uL   RBC 4.26 3.87 - 5.11 MIL/uL   Hemoglobin 11.8 (L) 12.0 - 15.0 g/dL   HCT 36.8 36.0 - 46.0 %   MCV 86.4 78.0 - 100.0 fL   MCH 27.7 26.0 - 34.0 pg   MCHC 32.1 30.0 - 36.0 g/dL   RDW  12.3 11.5 - 15.5 %   Platelets 246 150 - 400 K/uL  Basic metabolic panel  Result Value Ref Range   Sodium 144 137 - 147 mEq/L   Potassium 3.5 (L) 3.7 - 5.3 mEq/L   Chloride 105 96 - 112 mEq/L   CO2 26 19 - 32 mEq/L   Glucose, Bld 117 (H) 70 - 99 mg/dL   BUN 15 6 - 23 mg/dL   Creatinine, Ser 0.98 0.50 - 1.10 mg/dL   Calcium 9.2 8.4 - 10.5 mg/dL   GFR calc non Af Amer 66 (L) >90 mL/min   GFR calc Af Amer 77 (L) >90 mL/min   Anion gap 13 5 - 15  Troponin I (MHP)  Result Value Ref Range   Troponin I <0.30 <0.30 ng/mL  Protime-INR (if pt is taking Coumadin)  Result Value Ref Range   Prothrombin Time 13.9 11.6 - 15.2 seconds   INR 1.06 0.00 - 1.49   Imaging Review Dg Chest Port 1 View  12/06/2013   CLINICAL DATA:  Headache with LEFT chest pain for 3 days.  EXAM: PORTABLE CHEST - 1 VIEW  COMPARISON:  None.  FINDINGS: The heart size and mediastinal contours are within normal limits. Both lungs are clear. The visualized skeletal structures are unremarkable.  IMPRESSION: No active disease.   Electronically Signed   By: Elon Alas   On: 12/06/2013 02:07     EKG Interpretation   Date/Time:  Monday December 06 2013 01:32:59 EST Ventricular Rate:  77 PR Interval:  176 QRS Duration: 96 QT Interval:  399 QTC Calculation: 452 R Axis:   36 Text Interpretation:  Sinus rhythm Normal ECG When compared with ECG of  08/09/2011, No significant change was found Confirmed by [facility]  MD, Gonsalo Cuthbertson  ([id number]) on 12/06/2013 2:01:03 AM      MDM   Final diagnoses:  Headache, unspecified headache type  Musculoskeletal chest pain    Headache which is certainly consistent with migraine headache. This will be treated with IV fluids,-Hydramine, metoclopramide, and dexamethasone. Chest pain which seems to be chest wall pain. She will be given ketorolac for this. Initial workup is negative including normal chest x-ray and ECG and negative troponin.  She had excellent relief of symptoms with above  noted treatment. She is discharged with prescription for metoclopramide to use for nausea or headache, and is told to use over-the-counter naproxen for her chest wall pain.  Delora Fuel, MD 41/66/06 [id number]

## 2013-12-06 NOTE — ED Notes (Signed)
Pt c/o stinging in right leg with administration of medication. Notified EDP. Will monitor for now per EDP as this is a possible side effect of meditation and benadryl has been administered.

## 2013-12-06 NOTE — ED Notes (Signed)
Discharge instructions given, pt demonstrated teach back and verbal understanding. No concerns voiced.  

## 2013-12-06 NOTE — ED Notes (Signed)
Pt reports intermittent numbness and pain to the right side of her chest for over a week.  Pt also c/o headache

## 2013-12-06 NOTE — Discharge Instructions (Signed)
Take two naproxen tablets at a time, twice a day.  Migraine Headache A migraine headache is an intense, throbbing pain on one or both sides of your head. A migraine can last for 30 minutes to several hours. CAUSES  The exact cause of a migraine headache is not always known. However, a migraine may be caused when nerves in the brain become irritated and release chemicals that cause inflammation. This causes pain. Certain things may also trigger migraines, such as:  Alcohol.  Smoking.  Stress.  Menstruation.  Aged cheeses.  Foods or drinks that contain nitrates, glutamate, aspartame, or tyramine.  Lack of sleep.  Chocolate.  Caffeine.  Hunger.  Physical exertion.  Fatigue.  Medicines used to treat chest pain (nitroglycerine), birth control pills, estrogen, and some blood pressure medicines. SIGNS AND SYMPTOMS  Pain on one or both sides of your head.  Pulsating or throbbing pain.  Severe pain that prevents daily activities.  Pain that is aggravated by any physical activity.  Nausea, vomiting, or both.  Dizziness.  Pain with exposure to bright lights, loud noises, or activity.  General sensitivity to bright lights, loud noises, or smells. Before you get a migraine, you may get warning signs that a migraine is coming (aura). An aura may include:  Seeing flashing lights.  Seeing bright spots, halos, or zigzag lines.  Having tunnel vision or blurred vision.  Having feelings of numbness or tingling.  Having trouble talking.  Having muscle weakness. DIAGNOSIS  A migraine headache is often diagnosed based on:  Symptoms.  Physical exam.  A CT scan or MRI of your head. These imaging tests cannot diagnose migraines, but they can help rule out other causes of headaches. TREATMENT Medicines may be given for pain and nausea. Medicines can also be given to help prevent recurrent migraines.  HOME CARE INSTRUCTIONS  Only take over-the-counter or prescription  medicines for pain or discomfort as directed by your health care provider. The use of long-term narcotics is not recommended.  Lie down in a dark, quiet room when you have a migraine.  Keep a journal to find out what may trigger your migraine headaches. For example, write down:  What you eat and drink.  How much sleep you get.  Any change to your diet or medicines.  Limit alcohol consumption.  Quit smoking if you smoke.  Get 7-9 hours of sleep, or as recommended by your health care provider.  Limit stress.  Keep lights dim if bright lights bother you and make your migraines worse. SEEK IMMEDIATE MEDICAL CARE IF:   Your migraine becomes severe.  You have a fever.  You have a stiff neck.  You have vision loss.  You have muscular weakness or loss of muscle control.  You start losing your balance or have trouble walking.  You feel faint or pass out.  You have severe symptoms that are different from your first symptoms. MAKE SURE YOU:   Understand these instructions.  Will watch your condition.  Will get help right away if you are not doing well or get worse. Document Released: 01/07/2005 Document Revised: 05/24/2013 Document Reviewed: 09/14/2012 [facility] Patient Information 2015 [place], [place]. This information is not intended to replace advice given to you by your health care provider. Make sure you discuss any questions you have with your health care provider.  Chest Wall Pain Chest wall pain is pain in or around the bones and muscles of your chest. It may take up to 6 weeks to get better.  It may take longer if you must stay physically active in your work and activities.  CAUSES  Chest wall pain may happen on its own. However, it may be caused by:  A viral illness like the flu.  Injury.  Coughing.  Exercise.  Arthritis.  Fibromyalgia.  Shingles. HOME CARE INSTRUCTIONS   Avoid overtiring physical activity. Try not to strain or perform activities that  cause pain. This includes any activities using your chest or your abdominal and side muscles, especially if heavy weights are used.  Put ice on the sore area.  Put ice in a plastic bag.  Place a towel between your skin and the bag.  Leave the ice on for 15-20 minutes per hour while awake for the first 2 days.  Only take over-the-counter or prescription medicines for pain, discomfort, or fever as directed by your caregiver. SEEK IMMEDIATE MEDICAL CARE IF:   Your pain increases, or you are very uncomfortable.  You have a fever.  Your chest pain becomes worse.  You have new, unexplained symptoms.  You have nausea or vomiting.  You feel sweaty or lightheaded.  You have a cough with phlegm (sputum), or you cough up blood. MAKE SURE YOU:   Understand these instructions.  Will watch your condition.  Will get help right away if you are not doing well or get worse. Document Released: 01/07/2005 Document Revised: 04/01/2011 Document Reviewed: 09/03/2010 [facility] Patient Information 2015 [place], [place]. This information is not intended to replace advice given to you by your health care provider. Make sure you discuss any questions you have with your health care provider.  Metoclopramide tablets What is this medicine? METOCLOPRAMIDE (met oh kloe PRA mide) is used to treat the symptoms of gastroesophageal reflux disease (GERD) like heartburn. It is also used to treat people with slow emptying of the stomach and intestinal tract. This medicine may be used for other purposes; ask your health care provider or pharmacist if you have questions. COMMON BRAND NAME(S): Reglan What should I tell my health care provider before I take this medicine? They need to know if you have any of these conditions: -breast cancer -depression -diabetes -heart failure -high blood pressure -kidney disease -liver disease -Parkinson's disease or a movement disorder -pheochromocytoma -seizures -stomach  obstruction, bleeding, or perforation -an unusual or allergic reaction to metoclopramide, procainamide, sulfites, other medicines, foods, dyes, or preservatives -pregnant or trying to get pregnant -breast-feeding How should I use this medicine? Take this medicine by mouth with a glass of water. Follow the directions on the prescription label. Take this medicine on an empty stomach, about 30 minutes before eating. Take your doses at regular intervals. Do not take your medicine more often than directed. Do not stop taking except on the advice of your doctor or health care professional. A special MedGuide will be given to you by the pharmacist with each prescription and refill. Be sure to read this information carefully each time. Talk to your pediatrician regarding the use of this medicine in children. Special care may be needed. Overdosage: If you think you have taken too much of this medicine contact a poison control center or emergency room at once. NOTE: This medicine is only for you. Do not share this medicine with others. What if I miss a dose? If you miss a dose, take it as soon as you can. If it is almost time for your next dose, take only that dose. Do not take double or extra doses. What may interact with  this medicine? -acetaminophen -cyclosporine -digoxin -medicines for blood pressure -medicines for diabetes, including insulin -medicines for hay fever and other allergies -medicines for depression, especially an Monoamine Oxidase Inhibitor (MAOI) -medicines for Parkinson's disease, like levodopa -medicines for sleep or for pain -tetracycline This list may not describe all possible interactions. Give your health care provider a list of all the medicines, herbs, non-prescription drugs, or dietary supplements you use. Also tell them if you smoke, drink alcohol, or use illegal drugs. Some items may interact with your medicine. What should I watch for while using this medicine? It may  take a few weeks for your stomach condition to start to get better. However, do not take this medicine for longer than 12 weeks. The longer you take this medicine, and the more you take it, the greater your chances are of developing serious side effects. If you are an elderly patient, a female patient, or you have diabetes, you may be at an increased risk for side effects from this medicine. Contact your doctor immediately if you start having movements you cannot control such as lip smacking, rapid movements of the tongue, involuntary or uncontrollable movements of the eyes, head, arms and legs, or muscle twitches and spasms. Patients and their families should watch out for worsening depression or thoughts of suicide. Also watch out for any sudden or severe changes in feelings such as feeling anxious, agitated, panicky, irritable, hostile, aggressive, impulsive, severely restless, overly excited and hyperactive, or not being able to sleep. If this happens, especially at the beginning of treatment or after a change in dose, call your doctor. Do not treat yourself for high fever. Ask your doctor or health care professional for advice. You may get drowsy or dizzy. Do not drive, use machinery, or do anything that needs mental alertness until you know how this drug affects you. Do not stand or sit up quickly, especially if you are an older patient. This reduces the risk of dizzy or fainting spells. Alcohol can make you more drowsy and dizzy. Avoid alcoholic drinks. What side effects may I notice from receiving this medicine? Side effects that you should report to your doctor or health care professional as soon as possible: -allergic reactions like skin rash, itching or hives, swelling of the face, lips, or tongue -abnormal production of milk in females -breast enlargement in both males and females -change in the way you walk -difficulty moving, speaking or swallowing -drooling, lip smacking, or rapid movements  of the tongue -excessive sweating -fever -involuntary or uncontrollable movements of the eyes, head, arms and legs -irregular heartbeat or palpitations -muscle twitches and spasms -unusually weak or tired Side effects that usually do not require medical attention (report to your doctor or health care professional if they continue or are bothersome): -change in sex drive or performance -depressed mood -diarrhea -difficulty sleeping -headache -menstrual changes -restless or nervous This list may not describe all possible side effects. Call your doctor for medical advice about side effects. You may report side effects to FDA at 1-800-FDA-1088. Where should I keep my medicine? Keep out of the reach of children. Store at room temperature between 20 and 25 degrees C (68 and 77 degrees F). Protect from light. Keep container tightly closed. Throw away any unused medicine after the expiration date. NOTE: This sheet is a summary. It may not cover all possible information. If you have questions about this medicine, talk to your doctor, pharmacist, or health care provider.  2015, [employer]. (2011-05-07 13:04:38)

## 2014-03-26 ENCOUNTER — Emergency Department (HOSPITAL_COMMUNITY)
Admission: EM | Admit: 2014-03-26 | Discharge: 2014-03-26 | Disposition: A | Discharge status: Home / Self Care | Payer: Self-pay | Attending: Emergency Medicine | Admitting: Emergency Medicine

## 2014-03-26 ENCOUNTER — Encounter (HOSPITAL_COMMUNITY): Payer: Self-pay | Admitting: Emergency Medicine

## 2014-03-26 DIAGNOSIS — J029 Acute pharyngitis, unspecified: Secondary | ICD-10-CM | POA: Insufficient documentation

## 2014-03-26 DIAGNOSIS — R0981 Nasal congestion: Secondary | ICD-10-CM

## 2014-03-26 DIAGNOSIS — H66002 Acute suppurative otitis media without spontaneous rupture of ear drum, left ear: Secondary | ICD-10-CM

## 2014-03-26 DIAGNOSIS — Z792 Long term (current) use of antibiotics: Secondary | ICD-10-CM | POA: Insufficient documentation

## 2014-03-26 DIAGNOSIS — J3489 Other specified disorders of nose and nasal sinuses: Secondary | ICD-10-CM | POA: Insufficient documentation

## 2014-03-26 DIAGNOSIS — G43909 Migraine, unspecified, not intractable, without status migrainosus: Secondary | ICD-10-CM | POA: Insufficient documentation

## 2014-03-26 MED ORDER — AMOXICILLIN 500 MG PO CAPS: 500.0000 mg | ORAL_CAPSULE | Freq: Three times a day (TID) | ORAL | Status: AC

## 2014-03-26 MED ORDER — CETIRIZINE-PSEUDOEPHEDRINE ER 5-120 MG PO TB12: 1.0000 | ORAL_TABLET | Freq: Two times a day (BID) | ORAL | Status: DC

## 2014-03-26 NOTE — ED Provider Notes (Signed)
CSN: [id number]     Arrival date & time 03/26/14  0822 History   First MD Initiated Contact with Patient 03/26/14 [phone]     Chief Complaint  Patient presents with  . Otalgia     (Consider location/radiation/quality/duration/timing/severity/associated sxs/prior Treatment) The history is provided by the patient.   Sara Washington is a 49 y.o. female presenting with a 2 week history of uri type symptoms which includes nasal and sinus congestion with clear rhinorrhea, mild sore throat, nonproductive cough and now a one week history of left ear pressure and decreased hearing acuity. She reports minimal pain.  Symptoms due to not include  Tinnitus, dizziness, shortness of breath, chest pain,  Nausea, vomiting or diarrhea.  The patient has taken an zyrtec d with no significant improvement in symptoms.      Past Medical History  Diagnosis Date  . Migraine    Past Surgical History  Procedure Laterality Date  . Cesarean section    . Abdominal hysterectomy     Family History  Problem Relation Age of Onset  . Diabetes Mother    History  Substance Use Topics  . Smoking status: Never Smoker   . Smokeless tobacco: Not on file  . Alcohol Use: No   OB History    Gravida Para Term Preterm AB TAB SAB Ectopic Multiple Living   2 2 2       2      Review of Systems  Constitutional: Negative for fever and chills.  HENT: Positive for congestion, ear pain, hearing loss, rhinorrhea, sinus pressure and sore throat. Negative for ear discharge, facial swelling, trouble swallowing and voice change.   Eyes: Negative for discharge.  Respiratory: Positive for cough. Negative for shortness of breath, wheezing and stridor.   Cardiovascular: Negative for chest pain.  Gastrointestinal: Negative for abdominal pain.  Genitourinary: Negative.       Allergies  Review of patient's allergies indicates no known allergies.  Home Medications   Prior to Admission medications   Medication Sig Start Date End  Date Taking? Authorizing Provider  amoxicillin (AMOXIL) 500 MG capsule Take 1 capsule (500 mg total) by mouth 3 (three) times daily. 03/26/14 04/05/14  Evalee Jefferson, PA-C  cephALEXin (KEFLEX) 500 MG capsule 2 caps po bid x 7 days 10/04/13   Babette Relic, MD  cetirizine-pseudoephedrine (ZYRTEC-D) 5-120 MG per tablet Take 1 tablet by mouth 2 (two) times daily. 03/26/14   Evalee Jefferson, PA-C  metoCLOPramide (REGLAN) 10 MG tablet Take 1 tablet (10 mg total) by mouth every 6 (six) hours as needed for nausea (or headache). 11/91/47   Delora Fuel, MD  ondansetron (ZOFRAN ODT) 8 MG disintegrating tablet 8mg  ODT q4 hours prn nausea 10/04/13   Babette Relic, MD  oxyCODONE-acetaminophen (PERCOCET) 5-325 MG per tablet Take 2 tablets by mouth every 6 (six) hours as needed for severe pain. 10/04/13   Babette Relic, MD   BP 110/91 mmHg  Pulse 84  Temp(Src) 97.8 F (36.6 C) (Oral)  Resp 18  Ht 5\' 6"  (1.676 m)  Wt 160 lb (72.576 kg)  BMI 25.84 kg/m2  SpO2 95% Physical Exam  Constitutional: She is oriented to person, place, and time. She appears well-developed and well-nourished.  HENT:  Head: Normocephalic and atraumatic.  Right Ear: Tympanic membrane and ear canal normal.  Left Ear: Ear canal normal. No drainage. Tympanic membrane is erythematous and bulging. Decreased hearing is noted.  Nose: Mucosal edema and rhinorrhea present.  Mouth/Throat: Uvula is midline, oropharynx  is clear and moist and mucous membranes are normal. No oropharyngeal exudate, posterior oropharyngeal edema, posterior oropharyngeal erythema or tonsillar abscesses.  Eyes: Conjunctivae are normal.  Cardiovascular: Normal rate and normal heart sounds.   Pulmonary/Chest: Effort normal. No respiratory distress. She has no wheezes. She has no rales.  Abdominal: Soft. There is no tenderness.  Musculoskeletal: Normal range of motion.  Neurological: She is alert and oriented to person, place, and time.  Skin: Skin is warm and dry. No rash noted.   Psychiatric: She has a normal mood and affect.    ED Course  Procedures (including critical care time) Labs Review Labs Reviewed - No data to display  Imaging Review No results found.   EKG Interpretation None      MDM   Final diagnoses:  Acute suppurative otitis media of left ear without spontaneous rupture of tympanic membrane, recurrence not specified  Nasal congestion with rhinorrhea    Pt placed on amoxil, advised she should continue zyrtec d, menthol drops, vicks, steam tx to try to reduce congestion.    The patient appears reasonably screened and/or stabilized for discharge and I doubt any other medical condition or other [facility] requiring further screening, evaluation, or treatment in the ED at this time prior to discharge.     Evalee Jefferson, PA-C 03/26/14 1814  Veryl Speak, MD 03/27/14 [phone]

## 2014-03-26 NOTE — Discharge Instructions (Signed)
Otitis Media Otitis media is redness, soreness, and inflammation of the middle ear. Otitis media may be caused by allergies or, most commonly, by infection. Often it occurs as a complication of the common cold. SIGNS AND SYMPTOMS Symptoms of otitis media may include:  Earache.  Fever.  Ringing in your ear.  Headache.  Leakage of fluid from the ear. DIAGNOSIS To diagnose otitis media, your health care provider will examine your ear with an otoscope. This is an instrument that allows your health care provider to see into your ear in order to examine your eardrum. Your health care provider also will ask you questions about your symptoms. TREATMENT  Typically, otitis media resolves on its own within 3-5 days. Your health care provider may prescribe medicine to ease your symptoms of pain. If otitis media does not resolve within 5 days or is recurrent, your health care provider may prescribe antibiotic medicines if he or she suspects that a bacterial infection is the cause. HOME CARE INSTRUCTIONS   If you were prescribed an antibiotic medicine, finish it all even if you start to feel better.  Take medicines only as directed by your health care provider.  Keep all follow-up visits as directed by your health care provider. SEEK MEDICAL CARE IF:  You have otitis media only in one ear, or bleeding from your nose, or both.  You notice a lump on your neck.  You are not getting better in 3-5 days.  You feel worse instead of better. SEEK IMMEDIATE MEDICAL CARE IF:   You have pain that is not controlled with medicine.  You have swelling, redness, or pain around your ear or stiffness in your neck.  You notice that part of your face is paralyzed.  You notice that the bone behind your ear (mastoid) is tender when you touch it. MAKE SURE YOU:   Understand these instructions.  Will watch your condition.  Will get help right away if you are not doing well or get worse. Document Released:  10/13/2003 Document Revised: 05/24/2013 Document Reviewed: 08/04/2012 [facility] Patient Information 2015 [place], [place]. This information is not intended to replace advice given to you by your health care provider. Make sure you discuss any questions you have with your health care provider.    Emergency Department Resource Guide 1) Find a Doctor and Pay Out of Pocket Although you won't have to find out who is covered by your insurance plan, it is a good idea to ask around and get recommendations. You will then need to call the office and see if the doctor you have chosen will accept you as a new patient and what types of options they offer for patients who are self-pay. Some doctors offer discounts or will set up payment plans for their patients who do not have insurance, but you will need to ask so you aren't surprised when you get to your appointment.  2) Contact Your Local Health Department Not all health departments have doctors that can see patients for sick visits, but many do, so it is worth a call to see if yours does. If you don't know where your local health department is, you can check in your phone book. The CDC also has a tool to help you locate your state's health department, and many state websites also have listings of all of their local health departments.  3) Find a [facility] If your illness is not likely to be very severe or complicated, you may want to try a  walk in clinic. These are popping up all over the country in pharmacies, drugstores, and shopping centers. They're usually staffed by nurse practitioners or physician assistants that have been trained to treat common illnesses and complaints. They're usually fairly quick and inexpensive. However, if you have serious medical issues or chronic medical problems, these are probably not your best option.  No Primary Care Doctor: - Call Health Connect at  [phone] - they can help you locate a primary care doctor that  accepts  your insurance, provides certain services, etc. - Physician Referral Service- [phone]  Chronic Pain Problems: Organization         Address  Phone   Notes  [facility]  [phone] Patients need to be referred by their primary care doctor.   Medication Assistance: Organization         Address  Phone   Notes  [facility] Medication [facility] [address] [place], [place] [address] [phone] --Must be a resident of [facility] -- Must have NO insurance coverage whatsoever (no Medicaid/ Medicare, etc.) -- The pt. MUST have a primary care doctor that directs their care regularly and follows them in the community   MedAssist  [phone]   [employer]  [phone]    Agencies that provide inexpensive medical care: Organization         Address  Phone   Notes  [place]  [phone]   [facility]    [phone]   [facility] [place], [place] [address] [phone]   [place] [address] [place] [phone]   Planned Parenthood    [phone]   [facility]    [phone]   [place] [address], [place] Phone:  [phone], Fax:  [phone] Hours of Operation:  9 am - 6 pm, M-F.  Also accepts Medicaid/Medicare and self-pay.  [facility] for [place], [address], [place] Phone: [phone], Fax: [phone]. Hours of Operation:  8:30 am - 5:30 pm, M-F.  Also accepts Medicaid and self-pay.  [facility] [place] [address] [place] Phone: [phone]   [place], [place], [place] [phone], Ext. [phone] Mondays & Thursdays: 7-9 AM.  First 15 patients are seen on a first come, first serve basis.    [place] Providers:  Organization          Address  Phone   Notes  [facility] [address], [place] [phone] Also accepts self-pay patients.  [facility] [address], [place]  [phone]   [place], [address], [place] [phone]   [facility] [address] [place] [phone]   Lucianne Lei [address], [place]   [phone] Only accepts [place] Access [place] patients after they have their name applied to their card.   Self-Pay (no insurance) in [facility]:  Organization         Address  Phone   Notes  Sickle Cell Patients, [facility] [place] [phone]   [facility] [place] [phone]   Gershon Mussel  [facility] [place]  Mobile, [address], [place] [phone]   [facility]/Dr. Osei-Bonsu  [address], [place] or [address], [place] [phone] Phone number for both [place] and [place] locations is the same.  [facility] [address] [place] [phone]   [facility] [address] [place] or [address] [phone]   [facility] [address] [place] [phone], phone; [phone], fax Sees patients 1st and 3rd Saturday of every month.  Must not qualify for public or private insurance (i.e. Medicaid, Medicare, [place] Health Choice, Veterans' Benefits)  Household income should be no more than 200% of the poverty level The clinic cannot treat you if you are pregnant or think you are pregnant  Sexually transmitted diseases are not treated at the clinic.    Dental Care: Organization         Address  Phone  Notes  [facility] [place] [phone] Accepts children up to age 72 who are enrolled in [place] or [place]; pregnant women with a Medicaid card; and children who have applied for Medicaid or [place] Health Choice, but were declined, whose parents can pay a reduced fee at time of service.  [facility] Department of [facility]  [address], [place] [phone] Accepts children up to age 58 who are enrolled in [place] or [place]; pregnant women with a Medicaid card; and children who have applied for Medicaid or [place] Health Choice, but were declined, whose parents can pay a reduced fee at time of service.  [place] Adult [facility] Access PROGRAM  [place] [phone] Patients are seen by appointment only. Walk-ins are not accepted. [place] will see patients 33 years of age and older. Monday - Tuesday (8am-5pm) Most Wednesdays (8:30-5pm) $30 per visit, cash only  [facility] Adult Dental Access PROGRAM  [address], [facility] [phone] Patients are seen by appointment only. Walk-ins are not accepted. [place] will see patients 14 years of age and older. One Wednesday Evening (Monthly: Volunteer Based).  $30 per visit, cash only  [place]  [phone] for adults; Children under age 55, call Graduate Pediatric Dentistry at [phone]. Children aged 73-14, please call [phone] to request a pediatric application.  Dental services are provided in all areas of dental care including fillings, crowns and bridges, complete and partial dentures, implants, gum treatment, root canals, and extractions. Preventive care is also provided. Treatment is provided to both adults and children. Patients are selected via a lottery and there is often a waiting list.   [facility] [address], [place]  [phone] [email or web address]   [facility] [address] [place], [place]  [phone], Ext. [phone] Second and Fourth Thursday of each month, opens at 6:30 AM; Clinic ends at 9 AM.  Patients are seen on a first-come first-served basis, and a limited number are seen during each clinic.   [facility]  [address] Hill[place] Danker [place], [place] [phone]   Eligibility Requirements You must have lived in [place], [place], or [place] counties for at least the last three months.   You cannot be eligible for state or federal sponsored healthcare  insurance, including [employer], [place], or [employer].   You generally cannot be eligible for healthcare insurance through your employer.    How to apply: Eligibility screenings are held every Tuesday and Wednesday afternoon from 1:00 pm until 4:00 pm. You do not need an appointment for the interview!  [facility] [address] [place], [place]   [place]  [place] Department  Park  [phone]    Behavioral Health Resources in the Community: Intensive Outpatient Programs Organization         Address  Phone  Notes  [place], [place], [place] [phone]   [facility] Outpatient [address] [place], [place]   ADS: [facility] [address] [place], [place]   [place] [address],  [place], [place] or [phone]   Substance Abuse Resources Organization         Address  Phone  Notes  Alcohol and Drug Services  [phone]   [place]  [phone]   [facility] [place]   Chinita Pester  [phone]   Residential & Outpatient Substance Abuse Program  [phone]   Psychological Services Organization         Address  Phone  Notes  [facility]  [place]  [phone]    [place] [address] [place] or [phone]    Mobile Crisis Teams Organization         Address  Phone  Notes  [facility], Mobile Crisis Care Unit  [phone]   [facility]  [address] [place], [place]   [place] [address], [place] [phone]    Self-Help/Support Groups Organization         Address  Phone             Notes  [place]. of [place] - variety of support groups  [place] Call for more information  Narcotics Anonymous (NA), [facility] [address], [employer] [place]  2 meetings at this location   Special educational needs teacher         Address  Phone  Notes  [facility]idential Treatment [place],    [place]  [phone]   [facility]  [address], [place] 623762, [place], [place]   [place], [place] [phone] Admissions: 8am-3pm M-F  Incentives Substance [place] [address]    [place], [place] [phone]   [facility] [address] [place], [place], [place]   [facility] [address],  [place], [place]   [facility] Programs - Intensive Outpatient [address], [place], [place]   [facility] ([place]  [place], [place] [phone] or [phone]   [facility] Services (RTS) [address], [place], [place] Accepts Medicaid  [facility] [place] [address],  [place] [phone] Substance Abuse/Addiction Treatment   [place] Organization         Address  Phone  Notes  [facility]  [phone]   Domenic Schwab, PhD [address] [place], [place]   [phone] or [phone])  [place]   [place], [place] [phone]   [facility] [address], [place], [place] [phone] Insurance/Medicaid/sponsorship through [facility] and Families [address], Ste [place], [place] [phone] [place], [place] [phone]    Dr. Adele Schilder  [phone]   [facility] [place] Dept. 1) [address] [place] 2) [place] 3)  [place] [address], [place] [phone]  [phone]   [place] [phone] or [phone] (After Hours)      Take your entire course of the antibiotic.  Other suggestions to help resolve your nasal congestion include menthol cough drops, vicks vapor inhaler stick, steamy bathroom.

## 2014-03-26 NOTE — ED Notes (Signed)
PT c/o sinus pressure and drainage x2 weeks ago and left ear pressure and decreased hearing x1 week.

## 2014-05-27 ENCOUNTER — Emergency Department (HOSPITAL_COMMUNITY)
Admission: EM | Admit: 2014-05-27 | Discharge: 2014-05-27 | Disposition: A | Discharge status: Home / Self Care | Payer: Self-pay | Attending: Emergency Medicine | Admitting: Emergency Medicine

## 2014-05-27 ENCOUNTER — Encounter (HOSPITAL_COMMUNITY): Payer: Self-pay | Admitting: Emergency Medicine

## 2014-05-27 DIAGNOSIS — M778 Other enthesopathies, not elsewhere classified: Secondary | ICD-10-CM | POA: Insufficient documentation

## 2014-05-27 DIAGNOSIS — IMO0002 Reserved for concepts with insufficient information to code with codable children: Secondary | ICD-10-CM

## 2014-05-27 DIAGNOSIS — Z79899 Other long term (current) drug therapy: Secondary | ICD-10-CM | POA: Insufficient documentation

## 2014-05-27 DIAGNOSIS — G43909 Migraine, unspecified, not intractable, without status migrainosus: Secondary | ICD-10-CM | POA: Insufficient documentation

## 2014-05-27 MED ORDER — IBUPROFEN 400 MG PO TABS
600.0000 mg | ORAL_TABLET | Freq: Once | ORAL | Status: AC
  Administered 2014-05-27: 600 mg via ORAL
  Filled 2014-05-27: qty 2

## 2014-05-27 MED ORDER — NAPROXEN 500 MG PO TABS: 500.0000 mg | ORAL_TABLET | Freq: Two times a day (BID) | ORAL | Status: DC

## 2014-05-27 NOTE — ED Provider Notes (Signed)
CSN: [id number]     Arrival date & time 05/27/14  0904 History   First MD Initiated Contact with Patient 05/27/14 [phone]     Chief Complaint  Patient presents with  . Arm Pain     (Consider location/radiation/quality/duration/timing/severity/associated sxs/prior Treatment) Patient is a 49 y.o. female presenting with arm pain. The history is provided by the patient.  Arm Pain This is a new problem. The current episode started in the past 7 days. The problem occurs constantly. The problem has been gradually worsening. Exacerbated by: using right arm. She has tried nothing for the symptoms.  Sara Washington is a 49 y.o. female who presents to the ED with right wrist and forearm pain. She states that over the years she has had carpel tunnel but over the past 4 days the symptoms are much worse. She reports doing repetitive motion at work in [employer] The pain starts in the wrist and radiates to the the elbow.  She denies any other problems. She has taken nothing for pain.   Past Medical History  Diagnosis Date  . Migraine    Past Surgical History  Procedure Laterality Date  . Cesarean section    . Abdominal hysterectomy     Family History  Problem Relation Age of Onset  . Diabetes Mother    History  Substance Use Topics  . Smoking status: Never Smoker   . Smokeless tobacco: Not on file  . Alcohol Use: No   OB History    Gravida Para Term Preterm AB TAB SAB Ectopic Multiple Living   2 2 2       2      Review of Systems Negative except as stated in HPI   Allergies  Review of patient's allergies indicates no known allergies.  Home Medications   Prior to Admission medications   Medication Sig Start Date End Date Taking? Authorizing Provider  cephALEXin (KEFLEX) 500 MG capsule 2 caps po bid x 7 days 10/04/13   Riki Altes, MD  cetirizine-pseudoephedrine (ZYRTEC-D) 5-120 MG per tablet Take 1 tablet by mouth 2 (two) times daily. 03/26/14   Evalee Jefferson, PA-C  metoCLOPramide  (REGLAN) 10 MG tablet Take 1 tablet (10 mg total) by mouth every 6 (six) hours as needed for nausea (or headache). 46/65/99   Delora Fuel, MD  naproxen (NAPROSYN) 500 MG tablet Take 1 tablet (500 mg total) by mouth 2 (two) times daily. 05/27/14   [place] Bunnie Pion, NP  ondansetron (ZOFRAN ODT) 8 MG disintegrating tablet 8mg  ODT q4 hours prn nausea 10/04/13   Riki Altes, MD  oxyCODONE-acetaminophen (PERCOCET) 5-325 MG per tablet Take 2 tablets by mouth every 6 (six) hours as needed for severe pain. 10/04/13   Riki Altes, MD   BP 113/66 mmHg  Pulse 72  Temp(Src) 98 F (36.7 C) (Oral)  Resp 20  Ht 5\' 6"  (1.676 m)  Wt 150 lb (68.04 kg)  BMI 24.22 kg/m2  SpO2 100% Physical Exam  Constitutional: She is oriented to person, place, and time. She appears well-developed and well-nourished. No distress.  HENT:  Head: Normocephalic.  Eyes: EOM are normal.  Neck: Neck supple.  Cardiovascular: Normal rate.   Pulmonary/Chest: Effort normal.  Musculoskeletal: Normal range of motion.       Right wrist: She exhibits tenderness. She exhibits no deformity and no laceration. Decreased range of motion: due to pain. Swelling: minimal.       Arms: Tenderness with palpation and range of motion that starts on the  palmar aspect of the right wrist and radiates to the forearm to the elbow. Radial pulses 2+ bilateral, adequate circulation, equal grips, good touch sensation.  Neurological: She is alert and oriented to person, place, and time. No cranial nerve deficit.  Skin: Skin is warm and dry.  Psychiatric: She has a normal mood and affect. Her behavior is normal.  Nursing note and vitals reviewed.   ED Course  Procedures (including critical care time) Wrist/forearm splint, ice NSAIDS, follow up with ortho.   Labs Review Labs Reviewed - No data to display  Imaging Review No results found.   EKG Interpretation None      MDM  49 y.o. female with hx of carpal tunnel with acute right wrist and forearm pain x  4 days. Will treat for tendonitis, wrist splint, sling, ice, NSAIDS and follow up with ortho. Discussed with the patient and all questioned fully answered. She will return here if any problems arise.   Final diagnoses:  Tendonitis of elbow or forearm       Ashley Murrain, NP 05/27/14 [place], MD 05/28/14 [phone]

## 2014-05-27 NOTE — Discharge Instructions (Signed)
Wear the splint for comfort. Use the sling for the next couple days, apply ice to the area. Follow up with ortho if the symptoms do not improve.

## 2014-05-27 NOTE — ED Notes (Signed)
Pt from home, states that she has had right wrist pain since her diagnosis of carpel tunnel multiple years ago. This week the pain has been shooting from her wrist down her arm. She does a lot of heavy lifting and repetitive movements at work.

## 2014-05-27 NOTE — ED Notes (Signed)
Gave patient ice pack to place on right wrist.

## 2014-08-05 ENCOUNTER — Emergency Department (HOSPITAL_COMMUNITY)
Admission: EM | Admit: 2014-08-05 | Discharge: 2014-08-05 | Disposition: A | Discharge status: Home / Self Care | Payer: Self-pay | Attending: Emergency Medicine | Admitting: Emergency Medicine

## 2014-08-05 ENCOUNTER — Encounter (HOSPITAL_COMMUNITY): Payer: Self-pay | Admitting: *Deleted

## 2014-08-05 DIAGNOSIS — Y93H2 Activity, gardening and landscaping: Secondary | ICD-10-CM | POA: Insufficient documentation

## 2014-08-05 DIAGNOSIS — Z79899 Other long term (current) drug therapy: Secondary | ICD-10-CM | POA: Insufficient documentation

## 2014-08-05 DIAGNOSIS — Y9289 Other specified places as the place of occurrence of the external cause: Secondary | ICD-10-CM | POA: Insufficient documentation

## 2014-08-05 DIAGNOSIS — Z791 Long term (current) use of non-steroidal anti-inflammatories (NSAID): Secondary | ICD-10-CM | POA: Insufficient documentation

## 2014-08-05 DIAGNOSIS — Y998 Other external cause status: Secondary | ICD-10-CM | POA: Insufficient documentation

## 2014-08-05 DIAGNOSIS — G43909 Migraine, unspecified, not intractable, without status migrainosus: Secondary | ICD-10-CM | POA: Insufficient documentation

## 2014-08-05 DIAGNOSIS — T63441A Toxic effect of venom of bees, accidental (unintentional), initial encounter: Secondary | ICD-10-CM | POA: Insufficient documentation

## 2014-08-05 MED ORDER — PREDNISONE 50 MG PO TABS
60.0000 mg | ORAL_TABLET | Freq: Once | ORAL | Status: AC
  Administered 2014-08-05: 60 mg via ORAL
  Filled 2014-08-05 (×2): qty 1

## 2014-08-05 MED ORDER — PREDNISONE 10 MG PO TABS: ORAL_TABLET | ORAL | Status: DC

## 2014-08-05 MED ORDER — HYDROXYZINE HCL 25 MG PO TABS
25.0000 mg | ORAL_TABLET | Freq: Once | ORAL | Status: AC
  Administered 2014-08-05: 25 mg via ORAL
  Filled 2014-08-05: qty 1

## 2014-08-05 NOTE — ED Notes (Signed)
Bee stung on Wednesday to lower legs and right hip. Pt states itching and soreness to the areas. NAD.

## 2014-08-05 NOTE — ED Provider Notes (Signed)
CSN: [id number]     Arrival date & time 08/05/14  1246 History   First MD Initiated Contact with Patient 08/05/14 1304     Chief Complaint  Patient presents with  . Insect Bite     (Consider location/radiation/quality/duration/timing/severity/associated sxs/prior Treatment) HPI Comments: Patient is a 49 year old female who presents to the emergency department with complaint of multiple bee stings.  The patient states that on Wednesday, July 13 she was mowing when several yellow jackets attacked her, stinging her lower legs, and also on the right buttocks. She states that she had very little problem on the first day after the sting, but now she has a burning sensation, at times pain, and a lot of itching in the areas, particularly at her lower legs. She has not had any difficulty with breathing or swallowing. She's not had any swelling of the face or tongue.  The history is provided by the patient.    Past Medical History  Diagnosis Date  . Migraine    Past Surgical History  Procedure Laterality Date  . Cesarean section    . Abdominal hysterectomy     Family History  Problem Relation Age of Onset  . Diabetes Mother    History  Substance Use Topics  . Smoking status: Never Smoker   . Smokeless tobacco: Not on file  . Alcohol Use: No   OB History    Gravida Para Term Preterm AB TAB SAB Ectopic Multiple Living   2 2 2       2      Review of Systems  Constitutional: Negative for activity change.       All ROS Neg except as noted in HPI  HENT: Negative for nosebleeds.   Eyes: Negative for photophobia and discharge.  Respiratory: Negative for cough, shortness of breath and wheezing.   Cardiovascular: Negative for chest pain and palpitations.  Gastrointestinal: Negative for abdominal pain and blood in stool.  Genitourinary: Negative for dysuria, frequency and hematuria.  Musculoskeletal: Negative for back pain, arthralgias and neck pain.  Skin: Negative.   Neurological:  Positive for headaches. Negative for dizziness, seizures and speech difficulty.  Psychiatric/Behavioral: Negative for hallucinations and confusion.      Allergies  Review of patient's allergies indicates no known allergies.  Home Medications   Prior to Admission medications   Medication Sig Start Date End Date Taking? Authorizing Provider  cephALEXin (KEFLEX) 500 MG capsule 2 caps po bid x 7 days Patient not taking: Reported on 08/05/2014 10/04/13   Riki Altes, MD  cetirizine-pseudoephedrine (ZYRTEC-D) 5-120 MG per tablet Take 1 tablet by mouth 2 (two) times daily. 03/26/14   Evalee Jefferson, PA-C  metoCLOPramide (REGLAN) 10 MG tablet Take 1 tablet (10 mg total) by mouth every 6 (six) hours as needed for nausea (or headache). 63/87/56   Delora Fuel, MD  naproxen (NAPROSYN) 500 MG tablet Take 1 tablet (500 mg total) by mouth 2 (two) times daily. 05/27/14   Hope Bunnie Pion, NP  ondansetron (ZOFRAN ODT) 8 MG disintegrating tablet 8mg  ODT q4 hours prn nausea Patient not taking: Reported on 08/05/2014 10/04/13   Riki Altes, MD  oxyCODONE-acetaminophen (PERCOCET) 5-325 MG per tablet Take 2 tablets by mouth every 6 (six) hours as needed for severe pain. Patient not taking: Reported on 08/05/2014 10/04/13   Riki Altes, MD   BP 124/77 mmHg  Pulse 80  Temp(Src) 98 F (36.7 C) (Oral)  Resp 14  Ht 5\' 5"  (1.651 m)  Wt 150 lb (68.04 kg)  BMI 24.96 kg/m2  SpO2 98% Physical Exam  Constitutional: She is oriented to person, place, and time. She appears well-developed and well-nourished.  Non-toxic appearance.  HENT:  Head: Normocephalic.  Right Ear: Tympanic membrane and external ear normal.  Left Ear: Tympanic membrane and external ear normal.  No facial swelling. Airway is patent. No swelling of the tongue.  Eyes: EOM and lids are normal. Pupils are equal, round, and reactive to light.  Neck: Normal range of motion. Neck supple. Carotid bruit is not present.  Cardiovascular: Normal rate, regular rhythm,  normal heart sounds, intact distal pulses and normal pulses.   Pulmonary/Chest: Breath sounds normal. No respiratory distress. She has no wheezes. She has no rales.  Abdominal: Soft. Bowel sounds are normal. There is no tenderness. There is no guarding.  Musculoskeletal: Normal range of motion.  Full range of motion of the toes and ankles of the right and left lower extremity. Dorsalis pedis and posterior tibial pulses are 2+.  Lymphadenopathy:       Head (right side): No submandibular adenopathy present.       Head (left side): No submandibular adenopathy present.    She has no cervical adenopathy.  Neurological: She is alert and oriented to person, place, and time. She has normal strength. No cranial nerve deficit or sensory deficit.  Skin: Skin is warm and dry.  There is several red raised areas consistent with stings of the right and left lower leg. The areas are not hot. There no red streaks appreciated. There is full range of motion of the toes and ankle.  Psychiatric: She has a normal mood and affect. Her speech is normal.  Nursing note and vitals reviewed.   ED Course  Procedures (including critical care time) Labs Review Labs Reviewed - No data to display  Imaging Review No results found.   EKG Interpretation None      MDM  Patient sustained multiple stings 2-3 days ago. No evidence for any anaphylactic related issues. The vital signs are within normal limits. The patient speaks in complete sentences without any problem whatsoever. The patient is ambulatory without problem.  The patient will be treated with a short course of prednisone, and asked to use Allegra during the day, and may use Benadryl at bedtime if needed.    Final diagnoses:  None    **I have reviewed nursing notes, vital signs, and all appropriate lab and imaging results for this patient.Lily Kocher, PA-C 08/05/14 1359  Ripley Fraise, MD 08/05/14 1540

## 2014-08-05 NOTE — Discharge Instructions (Signed)
Please use Allegra each morning, use Benadryl at bedtime if needed for itching and burning. Please use prednisone taperas prescribed. May return to the emergency department if any emergent changes, problems, or concerns.  Bee, Wasp, or Hornet Sting Your caregiver has diagnosed you as having an insect sting. An insect sting appears as a red lump in the skin that sometimes has a tiny hole in the center, or it may have a stinger in the center of the wound. The most common stings are from wasps, hornets and bees. Individuals have different reactions to insect stings.  A normal reaction may cause pain, swelling, and redness around the sting site.  A localized allergic reaction may cause swelling and redness that extends beyond the sting site.  A large local reaction may continue to develop over the next 12 to 36 hours.  On occasion, the reactions can be severe (anaphylactic reaction). An anaphylactic reaction may cause wheezing; difficulty breathing; chest pain; fainting; raised, itchy, red patches on the skin; a sick feeling to your stomach (nausea); vomiting; cramping; or diarrhea. If you have had an anaphylactic reaction to an insect sting in the past, you are more likely to have one again. HOME CARE INSTRUCTIONS   With bee stings, a small sac of poison is left in the wound. Brushing across this with something such as a credit card, or anything similar, will help remove this and decrease the amount of the reaction. This same procedure will not help a wasp sting as they do not leave behind a stinger and poison sac.  Apply a cold compress for 10 to 20 minutes every hour for 1 to 2 days, depending on severity, to reduce swelling and itching.  To lessen pain, a paste made of water and baking soda may be rubbed on the bite or sting and left on for 5 minutes.  To relieve itching and swelling, you may use take medication or apply medicated creams or lotions as directed.  Only take over-the-counter or  prescription medicines for pain, discomfort, or fever as directed by your caregiver.  Wash the sting site daily with soap and water. Apply antibiotic ointment on the sting site as directed.  If you suffered a severe reaction:  If you did not require hospitalization, an adult will need to stay with you for 24 hours in case the symptoms return.  You may need to wear a medical bracelet or necklace stating the allergy.  You and your family need to learn when and how to use an anaphylaxis kit or epinephrine injection.  If you have had a severe reaction before, always carry your anaphylaxis kit with you. SEEK MEDICAL CARE IF:   None of the above helps within 2 to 3 days.  The area becomes red, warm, tender, and swollen beyond the area of the bite or sting.  You have an oral temperature above 102 F (38.9 C). SEEK IMMEDIATE MEDICAL CARE IF:  You have symptoms of an allergic reaction which are:  Wheezing.  Difficulty breathing.  Chest pain.  Lightheadedness or fainting.  Itchy, raised, red patches on the skin.  Nausea, vomiting, cramping or diarrhea. ANY OF THESE SYMPTOMS MAY REPRESENT A SERIOUS PROBLEM THAT IS AN EMERGENCY. Do not wait to see if the symptoms will go away. Get medical help right away. Call your local emergency services (911 in U.S.). DO NOT drive yourself to the hospital. MAKE SURE YOU:   Understand these instructions.  Will watch your condition.  Will get help right  away if you are not doing well or get worse. Document Released: 01/07/2005 Document Revised: 04/01/2011 Document Reviewed: 06/24/2009 [facility] Patient Information 2015 [place], [place]. This information is not intended to replace advice given to you by your health care provider. Make sure you discuss any questions you have with your health care provider.

## 2015-02-04 ENCOUNTER — Emergency Department (HOSPITAL_COMMUNITY): Payer: Self-pay

## 2015-02-04 ENCOUNTER — Emergency Department (HOSPITAL_COMMUNITY)
Admission: EM | Admit: 2015-02-04 | Discharge: 2015-02-04 | Disposition: A | Discharge status: Home / Self Care | Payer: Self-pay | Attending: Emergency Medicine | Admitting: Emergency Medicine

## 2015-02-04 ENCOUNTER — Encounter (HOSPITAL_COMMUNITY): Payer: Self-pay | Admitting: Emergency Medicine

## 2015-02-04 DIAGNOSIS — N3091 Cystitis, unspecified with hematuria: Secondary | ICD-10-CM | POA: Insufficient documentation

## 2015-02-04 DIAGNOSIS — Z8679 Personal history of other diseases of the circulatory system: Secondary | ICD-10-CM | POA: Insufficient documentation

## 2015-02-04 LAB — CBC WITH DIFFERENTIAL/PLATELET
BASOS ABS: 0 10*3/uL (ref 0.0–0.1)
Basophils Relative: 1 %
EOS ABS: 0.1 10*3/uL (ref 0.0–0.7)
EOS PCT: 1 %
HCT: 40.6 % (ref 36.0–46.0)
Hemoglobin: 12.8 g/dL (ref 12.0–15.0)
LYMPHS PCT: 46 %
Lymphs Abs: 1.9 10*3/uL (ref 0.7–4.0)
MCH: 27.5 pg (ref 26.0–34.0)
MCHC: 31.5 g/dL (ref 30.0–36.0)
MCV: 87.1 fL (ref 78.0–100.0)
MONO ABS: 0.2 10*3/uL (ref 0.1–1.0)
Monocytes Relative: 5 %
Neutro Abs: 2 10*3/uL (ref 1.7–7.7)
Neutrophils Relative %: 47 %
PLATELETS: 263 10*3/uL (ref 150–400)
RBC: 4.66 MIL/uL (ref 3.87–5.11)
RDW: 12.2 % (ref 11.5–15.5)
WBC: 4.1 10*3/uL (ref 4.0–10.5)

## 2015-02-04 LAB — URINALYSIS, ROUTINE W REFLEX MICROSCOPIC
Bilirubin Urine: NEGATIVE
GLUCOSE, UA: NEGATIVE mg/dL
Ketones, ur: NEGATIVE mg/dL
LEUKOCYTES UA: NEGATIVE
Nitrite: NEGATIVE
Protein, ur: NEGATIVE mg/dL
SPECIFIC GRAVITY, URINE: 1.025 (ref 1.005–1.030)
pH: 5 (ref 5.0–8.0)

## 2015-02-04 LAB — BASIC METABOLIC PANEL
ANION GAP: 8 (ref 5–15)
BUN: 14 mg/dL (ref 6–20)
CALCIUM: 9.6 mg/dL (ref 8.9–10.3)
CO2: 27 mmol/L (ref 22–32)
Chloride: 108 mmol/L (ref 101–111)
Creatinine, Ser: 0.9 mg/dL (ref 0.44–1.00)
GFR calc Af Amer: >60 mL/min (ref >60)
GLUCOSE: 108 mg/dL — AB (ref 65–99)
POTASSIUM: 4 mmol/L (ref 3.5–5.1)
Sodium: 143 mmol/L (ref 135–145)

## 2015-02-04 LAB — URINE MICROSCOPIC-ADD ON

## 2015-02-04 MED ORDER — IOHEXOL 300 MG/ML  SOLN
25.0000 mL | Freq: Once | INTRAMUSCULAR | Status: AC | PRN
  Administered 2015-02-04: 25 mL via ORAL

## 2015-02-04 MED ORDER — NITROFURANTOIN MONOHYD MACRO 100 MG PO CAPS: 100.0000 mg | ORAL_CAPSULE | Freq: Two times a day (BID) | ORAL | Status: DC

## 2015-02-04 MED ORDER — NITROFURANTOIN MONOHYD MACRO 100 MG PO CAPS
100.0000 mg | ORAL_CAPSULE | Freq: Two times a day (BID) | ORAL | Status: DC
  Administered 2015-02-04: 100 mg via ORAL

## 2015-02-04 MED ORDER — SODIUM CHLORIDE 0.9 % IV BOLUS (SEPSIS)
500.0000 mL | Freq: Once | INTRAVENOUS | Status: AC
  Administered 2015-02-04: 500 mL via INTRAVENOUS

## 2015-02-04 MED ORDER — IOHEXOL 300 MG/ML  SOLN
100.0000 mL | Freq: Once | INTRAMUSCULAR | Status: AC | PRN
  Administered 2015-02-04: 100 mL via INTRAVENOUS

## 2015-02-04 NOTE — Discharge Instructions (Signed)
CT scan showed no unusual findings. Will prescribe antibiotic twice a day for 1 week. Increase fluids.  Resource guide given.    Emergency Department Resource Guide 1) Find a Doctor and Pay Out of Pocket Although you won't have to find out who is covered by your insurance plan, it is a good idea to ask around and get recommendations. You will then need to call the office and see if the doctor you have chosen will accept you as a new patient and what types of options they offer for patients who are self-pay. Some doctors offer discounts or will set up payment plans for their patients who do not have insurance, but you will need to ask so you aren't surprised when you get to your appointment.  2) Contact Your Local Health Department Not all health departments have doctors that can see patients for sick visits, but many do, so it is worth a call to see if yours does. If you don't know where your local health department is, you can check in your phone book. The CDC also has a tool to help you locate your state's health department, and many state websites also have listings of all of their local health departments.  3) Find a [facility] If your illness is not likely to be very severe or complicated, you may want to try a walk in clinic. These are popping up all over the country in pharmacies, drugstores, and shopping centers. They're usually staffed by nurse practitioners or physician assistants that have been trained to treat common illnesses and complaints. They're usually fairly quick and inexpensive. However, if you have serious medical issues or chronic medical problems, these are probably not your best option.  No Primary Care Doctor: - Call Health Connect at  [phone] - they can help you locate a primary care doctor that  accepts your insurance, provides certain services, etc. - Physician Referral Service- [phone]  Chronic Pain Problems: Organization         Address  Phone    Notes  [facility]  [phone] Patients need to be referred by their primary care doctor.   Medication Assistance: Organization         Address  Phone   Notes  [facility] Medication [facility] [address] [place], [place] [address] [phone] --Must be a resident of [facility] -- Must have NO insurance coverage whatsoever (no Medicaid/ Medicare, etc.) -- The pt. MUST have a primary care doctor that directs their care regularly and follows them in the community   [facility]ist  [phone]   [employer]  [phone]    Agencies that provide inexpensive medical care: Organization         Address  Phone   Notes  [place]  [phone]   [facility]    [phone]   [facility] [place], [place] [address] [phone]   [place] [address] [place] [phone]   Planned Parenthood    [phone]   [facility]    [phone]   [place] [address], [place] Phone:  [phone], Fax:  [phone] Hours of Operation:  9 am - 6 pm, M-F.  Also accepts Medicaid/Medicare and self-pay.  [facility] for [place], [address], [place] Phone: [phone],  Fax: [phone]. Hours of Operation:  8:30 am - 5:30 pm, M-F.  Also accepts Medicaid and self-pay.  [facility] [address], [place] Phone: [phone]   [place], [place], [place] [phone], Ext. [phone] Mondays & Thursdays: 7-9 AM.  First 15 patients are seen on a first come, first serve basis.    [place] Providers:  Organization         Address  Phone   Notes  [facility] [address],  [phone] Also accepts self-pay patients.  [facility]  [address], [place]  [phone]   [place], [address], [place] [phone]   [facility] [address], [place] [phone]   Lucianne Lei [address], [place]   [phone] Only accepts [place] Access [place] patients after they have their name applied to their card.   Self-Pay (no insurance) in [facility]:  Organization         Address  Phone   Notes  Sickle Cell Patients, [facility] [place] [phone]   [facility] [place] [phone]   [facility] [place], [place], [place] [phone]   [facility]/Dr. Osei-Bonsu  [address], [place] or [address], [place] [phone] Phone number for both [place] and [place] locations is the same.  [facility]gent Medical and [facility] [address], [place] [phone]   [facility] [address] [place] or [address] [phone]   [facility] [address] [place] [phone], phone; [phone], fax Sees patients 1st and 3rd Saturday of every month.  Must not qualify for public or private insurance (i.e. Medicaid, Medicare, [place] Health Choice, Veterans' Benefits)  Household income should be no more than 200% of the poverty level The clinic cannot treat you if you are pregnant or think you are pregnant  Sexually transmitted diseases are not treated at the clinic.    Dental Care: Organization         Address  Phone  Notes  [facility] Department of [facility] [place] [phone] Accepts children up to age 22 who are enrolled in [place] or [place]; pregnant women with a Medicaid card; and children who have  applied for Medicaid or [place] Health Choice, but were declined, whose parents can pay a reduced fee at time of service.  [facility] Department of [facility]  [address], [place] [phone] Accepts children up to age 33 who are enrolled in [place] or [place]; pregnant women with a Medicaid card; and children who have applied for Medicaid or [place] Health Choice, but were declined, whose parents can pay a reduced fee at time of service.  [place] Adult Dental Access PROGRAM  [place] [phone] Patients are seen by appointment only. Walk-ins are not accepted. [place] will see patients 13 years of age and older. Monday - Tuesday (8am-5pm) Most Wednesdays (8:30-5pm) $30 per visit, cash only  [place] Adult [employer] PROGRAM  [address], [employer] (  [phone] Patients are seen by appointment only. Walk-ins are not accepted. [place] will see patients 46 years of age and older. One Wednesday Evening (Monthly: Volunteer Based).  $30 per visit, cash only  [place]  [phone] for adults; Children under age 38, call Graduate Pediatric Dentistry at [phone]. Children aged 28-14, please call [phone] to request a pediatric application.  Dental services are provided in all areas of dental care including fillings, crowns and bridges, complete and partial dentures, implants, gum treatment, root canals, and extractions. Preventive care is also provided. Treatment is provided to both adults and children. Patients are selected via a lottery and there is often a waiting list.   [facility] [address] [place]  [phone] [email or web address]   [facility] [address] [place], [place] [phone], Ext. [phone] Second and Fourth Thursday of each month, opens at 6:30 AM; Clinic ends at 9 AM.  Patients are seen on a first-come first-served basis, and a  limited number are seen during each clinic.   [facility]  [address] Hill[place] D[place]er [place], [place] [phone]   Eligibility Requirements You must have lived in [place], [place], or [place] counties for at least the last three months.   You cannot be eligible for state or federal sponsored [employer], including [employer], [place], or [employer].   You generally cannot be eligible for healthcare insurance through your employer.    How to apply: Eligibility screenings are held every Tuesday and Wednesday afternoon from 1:00 pm until 4:00 pm. You do not need an appointment for the interview!  [facility] [address], [place], [place]   [place]  [place] Department  [place]  [phone]    Behavioral Health Resources in the Community: Intensive Outpatient Programs Organization         Address  Phone  Notes  [place]. [address], [place], [place] [phone]   [facility] Outpatient [address] [place], [place]   ADS: Alcohol & Drug Svcs [address] [place], [place]   [place] [address],  [place], [place] or [phone]   Substance Abuse Resources Organization         Address  Phone  Notes  Alcohol and Drug Services  [phone]   [place]  [phone]   [facility] [place]   Chinita Pester  [phone]   Residential & Outpatient Substance Abuse Program  [phone]   Psychological Services Organization         Address  Phone  Notes  [facility] [place]  [place]  [phone]   [place] [address] [place] or [phone]    Mobile Crisis Teams Organization          Address  Phone  Notes  [facility], Mobile Crisis Care Unit  [phone]   [facility]  [address] [place], [place]   [place] [address], [place] [phone]    Self-Help/Support Groups Organization         Address  Phone             Notes  [place]. of [place] - variety of support groups  336- Z7124617 Call for more information  Narcotics Anonymous (NA), [facility] [address], [place]  2 meetings at this location   Residential Facilities manager         Address  Phone  Notes  [facility]idential [facility] [place],    [place]  [phone]   [facility]  [address], [place] [id number], [place], [place]   [place], [place] [phone] Admissions: 8am-3pm M-F  Incentives Substance [place] [address],    [place] [facility] [place], [place] [phone]   [facility] [address] [place], [place], [place]   [facility] [address],  [place], [place]   [facility] - Intensive Outpatient [address]een [address], [place], [place]   [facility] ([place].,  [place], [place] [phone] or [phone]   [facility] Services (RTS) [address], [place], [place] Accepts Medicaid  [facility] [place] [address]  [place] [phone] Substance Abuse/Addiction Treatment   [facility] Organization         Address  Phone  Notes  [facility]  [phone]   Domenic Schwab, PhD [address] [place]lis [place], [place]   [phone] or [phone]   [place], [place] [phone]   [facility] [address], [place], [place] [phone] Insurance/Medicaid/sponsorship  through [facility] and Families [address]                                    [place], [place] [phone] [place] [address], [place] [phone]    Dr. Adele Schilder  [phone]   [facility] [place] Dept. 1) [address] [place] 2) [place] 3)  [place] [address], [place] [phone]  [phone]   [place] [phone] or [phone] (After Hours)

## 2015-02-04 NOTE — ED Provider Notes (Signed)
CSN: [id number]     Arrival date & time 02/04/15  [id number] History  By signing my name below, I, Sara Washington, attest that this documentation has been prepared under the direction and in the presence of Nat Christen, MD. Electronically Signed: Erling Washington, ED Scribe. 02/04/2015. 12:24 PM.    Chief Complaint  Patient presents with  . Hematuria   The history is provided by the patient. No language interpreter was used.     HPI Comments: Sara Washington is a 50 y.o. female who presents to the Emergency Department complaining of intermittent, moderate, hematuria that began yesterday. Pt states she went to urinate and when she wiped there was blood on the tissue paper and in the toilet bowl. She reports associated dysuria, right flank pain and intermittent urinary frequency. Pt has not taken any medications or other treatments prior to arrival. Pt endorses h/o prior UTIs. Pt no longer gets her menstrual period. She is not sexually active at this time. She denies any fevers, chills, hematochezia, or other associated symptoms at this time.   Past Medical History  Diagnosis Date  . Migraine    Past Surgical History  Procedure Laterality Date  . Cesarean section    . Abdominal hysterectomy     Family History  Problem Relation Age of Onset  . Diabetes Mother    Social History  Substance Use Topics  . Smoking status: Never Smoker   . Smokeless tobacco: None  . Alcohol Use: No   OB History    Gravida Para Term Preterm AB TAB SAB Ectopic Multiple Living   2 2 2       2      Review of Systems A complete 10 system review of systems was obtained and all systems are negative except as noted in the HPI and PMH.     Allergies  Review of patient's allergies indicates no known allergies.  Home Medications   Prior to Admission medications   Medication Sig Start Date End Date Taking? Authorizing Provider  cephALEXin (KEFLEX) 500 MG capsule 2 caps po bid x 7 days Patient not taking: Reported  on 08/05/2014 10/04/13   Riki Altes, MD  cetirizine-pseudoephedrine (ZYRTEC-D) 5-120 MG per tablet Take 1 tablet by mouth 2 (two) times daily. Patient not taking: Reported on 08/05/2014 03/26/14   Evalee Jefferson, PA-C  metoCLOPramide (REGLAN) 10 MG tablet Take 1 tablet (10 mg total) by mouth every 6 (six) hours as needed for nausea (or headache). Patient not taking: Reported on 08/05/2014 [id number]   Delora Fuel, MD  naproxen (NAPROSYN) 500 MG tablet Take 1 tablet (500 mg total) by mouth 2 (two) times daily. Patient not taking: Reported on 08/05/2014 05/27/14   Ashley Murrain, NP  nitrofurantoin, macrocrystal-monohydrate, (MACROBID) 100 MG capsule Take 1 capsule (100 mg total) by mouth 2 (two) times daily. 02/04/15   Nat Christen, MD  ondansetron (ZOFRAN ODT) 8 MG disintegrating tablet 8mg  ODT q4 hours prn nausea Patient not taking: Reported on 08/05/2014 10/04/13   Riki Altes, MD  oxyCODONE-acetaminophen (PERCOCET) 5-325 MG per tablet Take 2 tablets by mouth every 6 (six) hours as needed for severe pain. Patient not taking: Reported on 08/05/2014 10/04/13   Riki Altes, MD  predniSONE (DELTASONE) 10 MG tablet 4,3,2,1,1 - take with food 08/05/14   Lily Kocher, PA-C   Triage Vitals: 113/82 mmHg  Pulse 74  Temp(Src) 97.5 F (36.4 C) (Oral)  Resp 16  Ht 5\' 6"  (1.676 m)  Wt 150 lb (68.04 kg)  BMI 24.22 kg/m2  SpO2 99%  Physical Exam  Constitutional: She is oriented to person, place, and time. She appears well-developed and well-nourished.  HENT:  Head: Normocephalic and atraumatic.  Eyes: Conjunctivae and EOM are normal. Pupils are equal, round, and reactive to light.  Neck: Normal range of motion. Neck supple.  Cardiovascular: Normal rate, regular rhythm and normal heart sounds.   Pulmonary/Chest: Effort normal and breath sounds normal. No respiratory distress.  Abdominal: Soft. Bowel sounds are normal. There is no tenderness. There is CVA tenderness (right).  Musculoskeletal: Normal range of motion.   Neurological: She is alert and oriented to person, place, and time.  Skin: Skin is warm and dry.  Psychiatric: She has a normal mood and affect. Her behavior is normal.  Nursing note and vitals reviewed.   ED Course  Procedures (including critical care time)  DIAGNOSTIC STUDIES: Oxygen Saturation is 99% on RA, normal by my interpretation.    COORDINATION OF CARE: 9:00 AM- Will order UA. Advised fluids and abx rx if UA results positive. Pt advised of plan for treatment and pt agrees.   Labs Review Labs Reviewed  URINALYSIS, ROUTINE W REFLEX MICROSCOPIC (NOT AT [facility]) - Abnormal; Notable for the following:    APPearance CLOUDY (*)    Hgb urine dipstick LARGE (*)    All other components within normal limits  URINE MICROSCOPIC-ADD ON - Abnormal; Notable for the following:    Squamous Epithelial / LPF 0-5 (*)    Bacteria, UA MANY (*)    Casts GRANULAR CAST (*)    All other components within normal limits  BASIC METABOLIC PANEL - Abnormal; Notable for the following:    Glucose, Bld 108 (*)    All other components within normal limits  URINE CULTURE  CBC WITH DIFFERENTIAL/PLATELET    Imaging Review Ct Abdomen Pelvis W Contrast  02/04/2015  CLINICAL DATA:  Right flank pain.  Hematuria. EXAM: CT ABDOMEN AND PELVIS WITH CONTRAST TECHNIQUE: Multidetector CT imaging of the abdomen and pelvis was performed using the standard protocol following bolus administration of intravenous contrast. CONTRAST:  81mL OMNIPAQUE IOHEXOL 300 MG/ML SOLN, 12mL OMNIPAQUE IOHEXOL 300 MG/ML SOLN COMPARISON:  10/04/2013 FINDINGS: Liver, gallbladder, spleen, pancreas, adrenal glands, and kidneys are within normal limits No free-fluid.  No abnormal retroperitoneal adenopathy Appendix is unremarkable and marked by arrows on image 64. Uterus is absent.  Bladder and adnexa are within normal limits. No vertebral compression deformity. IMPRESSION: No acute intra-abdominal pathology. Electronically Signed   By: Marybelle Killings M.D.   On: 02/04/2015 10:57   I have personally reviewed and evaluated these lab results as part of my medical decision-making.   EKG Interpretation None      MDM   Final diagnoses:  Hemorrhagic cystitis   Patient presents with right flank pain and hematuria. She is hemodynamically stable. Urinalysis shows evidence of infection and hemoglobin. CT scan shows no acute findings. Rx Macrobid.  I personally performed the services described in this documentation, which was scribed in my presence. The recorded information has been reviewed and is accurate.      Nat Christen, MD 02/04/15 1225

## 2015-02-04 NOTE — ED Notes (Signed)
Pt reports hematuria and bilateral lower back pain that began yesterday. Pt also reports dysuria.

## 2015-02-06 LAB — URINE CULTURE
Culture: NO GROWTH
Special Requests: NORMAL

## 2015-10-19 ENCOUNTER — Emergency Department (HOSPITAL_COMMUNITY)
Admission: EM | Admit: 2015-10-19 | Discharge: 2015-10-19 | Disposition: A | Discharge status: Home / Self Care | Payer: Self-pay | Attending: Emergency Medicine | Admitting: Emergency Medicine

## 2015-10-19 ENCOUNTER — Emergency Department (HOSPITAL_COMMUNITY): Payer: Self-pay

## 2015-10-19 ENCOUNTER — Encounter (HOSPITAL_COMMUNITY): Payer: Self-pay | Admitting: Emergency Medicine

## 2015-10-19 DIAGNOSIS — X500XXA Overexertion from strenuous movement or load, initial encounter: Secondary | ICD-10-CM | POA: Insufficient documentation

## 2015-10-19 DIAGNOSIS — M79652 Pain in left thigh: Secondary | ICD-10-CM | POA: Insufficient documentation

## 2015-10-19 DIAGNOSIS — Y929 Unspecified place or not applicable: Secondary | ICD-10-CM | POA: Insufficient documentation

## 2015-10-19 DIAGNOSIS — Y990 Civilian activity done for income or pay: Secondary | ICD-10-CM | POA: Insufficient documentation

## 2015-10-19 DIAGNOSIS — S46912A Strain of unspecified muscle, fascia and tendon at shoulder and upper arm level, left arm, initial encounter: Secondary | ICD-10-CM | POA: Insufficient documentation

## 2015-10-19 DIAGNOSIS — Y9389 Activity, other specified: Secondary | ICD-10-CM | POA: Insufficient documentation

## 2015-10-19 MED ORDER — METHOCARBAMOL 500 MG PO TABS: 500.0000 mg | ORAL_TABLET | Freq: Two times a day (BID) | ORAL | 0 refills | Status: DC

## 2015-10-19 MED ORDER — DICLOFENAC SODIUM 50 MG PO TBEC: 50.0000 mg | DELAYED_RELEASE_TABLET | Freq: Two times a day (BID) | ORAL | 0 refills | Status: DC

## 2015-10-19 NOTE — ED Triage Notes (Signed)
Pt c/o LT shoulder and clavicle pain x 2 weeks. States she lifts a lot of boxes at work. Pt also wants an itchy, raised bump on RT arm to be evaluated.

## 2015-10-19 NOTE — ED Provider Notes (Signed)
Sara Washington   CSN: [id number] Arrival date & time: 10/19/15  0736     History   Chief Complaint Chief Complaint  Patient presents with  . Shoulder Pain    HPI Sara Washington is a 50 y.o. female.  No language interpreter was used.  Shoulder Pain   This is a new problem. The current episode started more than 1 week ago. The problem occurs constantly. The problem has been gradually worsening. The pain is present in the left upper leg. The quality of the pain is described as aching. The pain is at a severity of 4/10. The pain is moderate. Associated symptoms include limited range of motion. She has tried nothing for the symptoms. The treatment provided no relief. There has been no history of extremity trauma.  Pt complains of pain while moving arm.  Pain while at work.  Past Medical History:  Diagnosis Date  . Migraine     There are no active problems to display for this patient.   Past Surgical History:  Procedure Laterality Date  . ABDOMINAL HYSTERECTOMY    . CESAREAN SECTION      OB History    Gravida Para Term Preterm AB Living   2 2 2     2    SAB TAB Ectopic Multiple Live Births                   Home Medications    Prior to Admission medications   Medication Sig Start Date End Date Taking? Authorizing Provider  cephALEXin (KEFLEX) 500 MG capsule 2 caps po bid x 7 days Patient not taking: Reported on 08/05/2014 10/04/13   Riki Altes, MD  cetirizine-pseudoephedrine (ZYRTEC-D) 5-120 MG per tablet Take 1 tablet by mouth 2 (two) times daily. Patient not taking: Reported on 08/05/2014 03/26/14   Evalee Jefferson, PA-C  metoCLOPramide (REGLAN) 10 MG tablet Take 1 tablet (10 mg total) by mouth every 6 (six) hours as needed for nausea (or headache). Patient not taking: Reported on 08/05/2014 [id number]   Delora Fuel, MD  naproxen (NAPROSYN) 500 MG tablet Take 1 tablet (500 mg total) by mouth 2 (two) times daily. Patient not taking: Reported on 08/05/2014 05/27/14    Ashley Murrain, NP  nitrofurantoin, macrocrystal-monohydrate, (MACROBID) 100 MG capsule Take 1 capsule (100 mg total) by mouth 2 (two) times daily. 02/04/15   Nat Christen, MD  ondansetron (ZOFRAN ODT) 8 MG disintegrating tablet 8mg  ODT q4 hours prn nausea Patient not taking: Reported on 08/05/2014 10/04/13   Riki Altes, MD  oxyCODONE-acetaminophen (PERCOCET) 5-325 MG per tablet Take 2 tablets by mouth every 6 (six) hours as needed for severe pain. Patient not taking: Reported on 08/05/2014 10/04/13   Riki Altes, MD  predniSONE (DELTASONE) 10 MG tablet 4,3,2,1,1 - take with food 08/05/14   Lily Kocher, PA-C    Family History Family History  Problem Relation Age of Onset  . Diabetes Mother     Social History Social History  Substance Use Topics  . Smoking status: Never Smoker  . Smokeless tobacco: Never Used  . Alcohol use No     Allergies   Review of patient's allergies indicates no known allergies.   Review of Systems Review of Systems  All other systems reviewed and are negative.    Physical Exam Updated Vital Signs BP 125/88 (BP Location: Left Arm)   Pulse 74   Temp 97.7 F (36.5 C) (Oral)   Resp 16   Ht 5\' 6"  (1.676  m)   Wt 68 kg   SpO2 98%   BMI 24.21 kg/m   Physical Exam  Constitutional: She is oriented to person, place, and time. She appears well-developed and well-nourished.  HENT:  Head: Normocephalic.  Eyes: EOM are normal.  Neck: Normal range of motion.  Pulmonary/Chest: Effort normal.  Abdominal: She exhibits no distension.  Musculoskeletal:  Tender left shoulder, pain with range of motion,  Pain with abbuction,  nv and ns intact  Neurological: She is alert and oriented to person, place, and time.  Psychiatric: She has a normal mood and affect.  Nursing Washington and vitals reviewed.    ED Treatments / Results  Labs (all labs ordered are listed, but only abnormal results are displayed) Labs Reviewed - No data to display  EKG  EKG  Interpretation None       Radiology Dg Shoulder Left  Result Date: 10/19/2015 CLINICAL DATA:  Left shoulder pain, decreased range of motion, no known injury EXAM: LEFT SHOULDER - 2+ VIEW COMPARISON:  None. FINDINGS: The left humeral head is in normal position. The glenohumeral joint space appears normal. Minimal degenerative change is present from the acromion. The left [facility] joint is normally aligned. IMPRESSION: No acute abnormality. Electronically Signed   By: Ivar Drape M.D.   On: 10/19/2015 08:08    Procedures Procedures (including critical care time)  Medications Ordered in ED Medications - No data to display   Initial Impression / Assessment and Plan / ED Course  I have reviewed the triage vital signs and the nursing notes.  Pertinent labs & imaging results that were available during my care of the patient were reviewed by me and considered in my medical decision making (see chart for details).  Clinical Course   Pt advised to follow up with Dr. Luna Glasgow voltaren Robaxin  Final Clinical Impressions(s) / ED Diagnoses   Final diagnoses:  Left shoulder strain, initial encounter  An After Visit Summary was printed and given to the patient. Meds ordered this encounter  Medications  . diclofenac (VOLTAREN) 50 MG EC tablet    Sig: Take 1 tablet (50 mg total) by mouth 2 (two) times daily.    Dispense:  20 tablet    Refill:  0    Order Specific Question:   Supervising Provider    Answer:   MESNER, JASON [id number]  . methocarbamol (ROBAXIN) 500 MG tablet    Sig: Take 1 tablet (500 mg total) by mouth 2 (two) times daily.    Dispense:  20 tablet    Refill:  0    Order Specific Question:   Supervising Provider    Answer:   Merrily Pew [id number]   New Prescriptions New Prescriptions   No medications on file     Fransico Meadow, Hershal Coria 10/19/15 [id number]    Merrily Pew, MD 10/19/15 [phone]

## 2017-03-13 ENCOUNTER — Emergency Department (HOSPITAL_COMMUNITY): Payer: BC Managed Care – PPO

## 2017-03-13 ENCOUNTER — Encounter (HOSPITAL_COMMUNITY): Payer: Self-pay | Admitting: Emergency Medicine

## 2017-03-13 ENCOUNTER — Emergency Department (HOSPITAL_COMMUNITY)
Admission: EM | Admit: 2017-03-13 | Discharge: 2017-03-13 | Disposition: A | Discharge status: Home / Self Care | Payer: BC Managed Care – PPO | Attending: Emergency Medicine | Admitting: Emergency Medicine

## 2017-03-13 DIAGNOSIS — B350 Tinea barbae and tinea capitis: Secondary | ICD-10-CM | POA: Diagnosis not present

## 2017-03-13 DIAGNOSIS — M542 Cervicalgia: Secondary | ICD-10-CM | POA: Diagnosis not present

## 2017-03-13 DIAGNOSIS — R079 Chest pain, unspecified: Secondary | ICD-10-CM | POA: Diagnosis present

## 2017-03-13 DIAGNOSIS — R0602 Shortness of breath: Secondary | ICD-10-CM | POA: Insufficient documentation

## 2017-03-13 DIAGNOSIS — R0789 Other chest pain: Secondary | ICD-10-CM | POA: Insufficient documentation

## 2017-03-13 LAB — COMPREHENSIVE METABOLIC PANEL
ALK PHOS: 81 U/L (ref 38–126)
ALT: 15 U/L (ref 14–54)
AST: 16 U/L (ref 15–41)
Albumin: 4.1 g/dL (ref 3.5–5.0)
Anion gap: 10 (ref 5–15)
BUN: 15 mg/dL (ref 6–20)
CALCIUM: 9.1 mg/dL (ref 8.9–10.3)
CO2: 26 mmol/L (ref 22–32)
Chloride: 103 mmol/L (ref 101–111)
Creatinine, Ser: 0.78 mg/dL (ref 0.44–1.00)
GFR calc non Af Amer: >60 mL/min (ref >60)
GLUCOSE: 102 mg/dL — AB (ref 65–99)
Potassium: 4 mmol/L (ref 3.5–5.1)
SODIUM: 139 mmol/L (ref 135–145)
Total Bilirubin: 0.7 mg/dL (ref 0.3–1.2)
Total Protein: 7 g/dL (ref 6.5–8.1)

## 2017-03-13 LAB — CBC WITH DIFFERENTIAL/PLATELET
BASOS ABS: 0 10*3/uL (ref 0.0–0.1)
Basophils Relative: 0 %
EOS ABS: 0.1 10*3/uL (ref 0.0–0.7)
Eosinophils Relative: 3 %
HCT: 39 % (ref 36.0–46.0)
HEMOGLOBIN: 11.8 g/dL — AB (ref 12.0–15.0)
LYMPHS ABS: 1.4 10*3/uL (ref 0.7–4.0)
LYMPHS PCT: 37 %
MCH: 26.2 pg (ref 26.0–34.0)
MCHC: 30.3 g/dL (ref 30.0–36.0)
MCV: 86.7 fL (ref 78.0–100.0)
Monocytes Absolute: 0.2 10*3/uL (ref 0.1–1.0)
Monocytes Relative: 6 %
NEUTROS PCT: 54 %
Neutro Abs: 2 10*3/uL (ref 1.7–7.7)
Platelets: 254 10*3/uL (ref 150–400)
RBC: 4.5 MIL/uL (ref 3.87–5.11)
RDW: 12.3 % (ref 11.5–15.5)
WBC: 3.7 10*3/uL — AB (ref 4.0–10.5)

## 2017-03-13 LAB — D-DIMER, QUANTITATIVE: D-Dimer, Quant: <0.27 ug/mL-FEU (ref 0.00–0.50)

## 2017-03-13 LAB — TROPONIN I

## 2017-03-13 LAB — BRAIN NATRIURETIC PEPTIDE: B Natriuretic Peptide: 22 pg/mL (ref 0.0–100.0)

## 2017-03-13 MED ORDER — IBUPROFEN 600 MG PO TABS: 600.0000 mg | ORAL_TABLET | Freq: Three times a day (TID) | ORAL | 0 refills | Status: AC | PRN

## 2017-03-13 MED ORDER — METHOCARBAMOL 500 MG PO TABS: 500.0000 mg | ORAL_TABLET | Freq: Three times a day (TID) | ORAL | 0 refills | Status: DC | PRN

## 2017-03-13 MED ORDER — SELENIUM SULFIDE 2.25 % EX SHAM: 1.0000 "application " | MEDICATED_SHAMPOO | CUTANEOUS | 0 refills | Status: AC

## 2017-03-13 MED ORDER — FLUCONAZOLE 150 MG PO TABS: 150.0000 mg | ORAL_TABLET | Freq: Every day | ORAL | 0 refills | Status: AC

## 2017-03-13 NOTE — ED Triage Notes (Signed)
Patient states she started taking clindamycin for abscess tooth yesterday. Complaining of pain and "knots" to bilateral sides of neck. Also complaining of shortness of breath and pain to left side of chest with deep breathing and movement.

## 2017-03-13 NOTE — ED Provider Notes (Signed)
[facility] EMERGENCY DEPARTMENT Provider Note   CSN: [id number] Arrival date & time: 03/13/17  3500     History   Chief Complaint Chief Complaint  Patient presents with  . Neck Pain    HPI Sara Washington is a 52 y.o. female.  HPI Patient presents with several complaints.  She said several days of tender nodules to the back of her neck.  States she has had some scalp itching.  Denies any known rashes.  No neck stiffness, focal weakness or numbness.  Patient also complains of left-sided chest pain which she states is episodic over the last few months.  Pain is worse with movement and palpation.  States she states she does a lot of heavy lifting at her job.  She has noticed that she has had increased shortness of breath especially with exertion.  She has lower extremity swelling from time to time.  She attributes this to prolonged periods of standing.  No recent surgeries or prolonged immobilization.  Denies any fever or chills.  No cough. Past Medical History:  Diagnosis Date  . Migraine     There are no active problems to display for this patient.   Past Surgical History:  Procedure Laterality Date  . ABDOMINAL HYSTERECTOMY    . CESAREAN SECTION      OB History    Gravida Para Term Preterm AB Living   2 2 2     2    SAB TAB Ectopic Multiple Live Births                   Home Medications    Prior to Admission medications   Medication Sig Start Date End Date Taking? Authorizing Provider  clindamycin (CLEOCIN) 300 MG capsule Take 300 mg by mouth every 6 (six) hours.   Yes [provider]  fluconazole (DIFLUCAN) 150 MG tablet Take 1 tablet (150 mg total) by mouth daily. 03/13/17 04/24/17  Julianne Rice, MD  ibuprofen (ADVIL,MOTRIN) 600 MG tablet Take 1 tablet (600 mg total) by mouth 3 (three) times daily with meals as needed. 03/13/17   Julianne Rice, MD  methocarbamol (ROBAXIN) 500 MG tablet Take 1 tablet (500 mg total) by mouth every 8 (eight) hours as needed  for muscle spasms. 03/13/17   Julianne Rice, MD  Selenium Sulfide 2.25 % SHAM Apply 1 application topically 3 (three) times a week for 14 days. 03/14/17 03/28/17  Julianne Rice, MD    Family History Family History  Problem Relation Age of Onset  . Diabetes Mother     Social History Social History   Tobacco Use  . Smoking status: Never Smoker  . Smokeless tobacco: Never Used  Substance Use Topics  . Alcohol use: No  . Drug use: No     Allergies   Patient has no known allergies.   Review of Systems Review of Systems  Constitutional: Negative for chills, fatigue and fever.  HENT: Negative for congestion, sinus pain, sore throat and trouble swallowing.   Eyes: Negative for visual disturbance.  Respiratory: Positive for shortness of breath. Negative for cough and wheezing.   Cardiovascular: Positive for chest pain and leg swelling. Negative for palpitations.  Gastrointestinal: Negative for abdominal pain, constipation, diarrhea, nausea and vomiting.  Genitourinary: Negative for difficulty urinating, dysuria, flank pain and frequency.  Musculoskeletal: Negative for back pain, myalgias, neck pain and neck stiffness.  Skin: Negative for rash and wound.  Neurological: Negative for dizziness, weakness, light-headedness, numbness and headaches.  All other systems reviewed  and are negative.    Physical Exam Updated Vital Signs BP 132/88 (BP Location: Left Arm)   Pulse 68   Temp 97.8 F (36.6 C) (Oral)   Resp 16   Ht 5\' 6"  (1.676 m)   Wt 72.6 kg (160 lb)   SpO2 99%   BMI 25.82 kg/m   Physical Exam  Constitutional: She is oriented to person, place, and time. She appears well-developed and well-nourished. No distress.  HENT:  Head: Normocephalic and atraumatic.  Mouth/Throat: Oropharynx is clear and moist.  Patient with thickened plaque-like rash to the base of the scalp.  No purulent drainage, erythema or warmth.  Eyes: EOM are normal. Pupils are equal, round, and  reactive to light.  Neck: Normal range of motion. Neck supple.  No meningismus.  Patient has posterior cervical lymphadenopathy.  Cardiovascular: Normal rate and regular rhythm. Exam reveals no gallop and no friction rub.  No murmur heard. Pulmonary/Chest: Effort normal and breath sounds normal. No stridor. No respiratory distress. She has no wheezes. She has no rales. She exhibits tenderness.  Patient with left-sided pectoralis muscle tenderness to palpation.  No crepitance or deformity.  Abdominal: Soft. Bowel sounds are normal. There is no tenderness. There is no rebound and no guarding.  Musculoskeletal: Normal range of motion. She exhibits no edema or tenderness.  No midline thoracic or lumbar tenderness.  No CVA tenderness.  No lower extremity swelling, asymmetry or tenderness.  2+ distal pulses in all extremities.  Neurological: She is alert and oriented to person, place, and time.  5/5 motor in all extremities.  Sensation fully intact.  Skin: Skin is warm and dry. Capillary refill takes less than 2 seconds. No rash noted. No erythema.  Psychiatric: She has a normal mood and affect. Her behavior is normal.  Nursing note and vitals reviewed.    ED Treatments / Results  Labs (all labs ordered are listed, but only abnormal results are displayed) Labs Reviewed  CBC WITH DIFFERENTIAL/PLATELET - Abnormal; Notable for the following components:      Result Value   WBC 3.7 (*)    Hemoglobin 11.8 (*)    All other components within normal limits  COMPREHENSIVE METABOLIC PANEL - Abnormal; Notable for the following components:   Glucose, Bld 102 (*)    All other components within normal limits  BRAIN NATRIURETIC PEPTIDE  TROPONIN I  D-DIMER, QUANTITATIVE (NOT AT [facility])    EKG  EKG Interpretation  Date/Time:  Thursday March 13 2017 07:14:25 EST Ventricular Rate:  65 PR Interval:    QRS Duration: 97 QT Interval:  397 QTC Calculation: 413 R Axis:   39 Text Interpretation:  Sinus  rhythm Confirmed by Julianne Rice [phone]) on 03/13/2017 8:49:15 AM       Radiology No results found.  Procedures Procedures (including critical care time)  Medications Ordered in ED Medications - No data to display   Initial Impression / Assessment and Plan / ED Course  I have reviewed the triage vital signs and the nursing notes.  Pertinent labs & imaging results that were available during my care of the patient were reviewed by me and considered in my medical decision making (see chart for details).     Troponin and d-dimer are normal.  Low suspicion for coronary artery disease.  Pain is reproduced with palpation.  Likely chest wall in etiology. Will also treat for tinea capitis with reactive lymphadenopathy.  Return precautions have been given.  Patient has voiced understanding.  Final Clinical Impressions(s) /  ED Diagnoses   Final diagnoses:  Tinea capitis  Chest wall pain    ED Discharge Orders        Ordered    Selenium Sulfide 2.25 % SHAM  3 times weekly     03/13/17 1105    fluconazole (DIFLUCAN) 150 MG tablet  Daily     03/13/17 1105    methocarbamol (ROBAXIN) 500 MG tablet  Every 8 hours PRN     03/13/17 1105    ibuprofen (ADVIL,MOTRIN) 600 MG tablet  3 times daily with meals PRN     03/13/17 1105       Julianne Rice, MD 03/18/17 [phone]

## 2017-04-30 ENCOUNTER — Other Ambulatory Visit: Payer: Self-pay

## 2017-04-30 ENCOUNTER — Ambulatory Visit: Payer: BC Managed Care – PPO | Admitting: Family Medicine

## 2017-04-30 ENCOUNTER — Encounter: Payer: Self-pay | Admitting: Family Medicine

## 2017-04-30 ENCOUNTER — Other Ambulatory Visit (HOSPITAL_COMMUNITY)
Admission: RE | Admit: 2017-04-30 | Discharge: 2017-04-30 | Disposition: A | Discharge status: Home / Self Care | Payer: BC Managed Care – PPO | Source: Ambulatory Visit | Attending: Family Medicine | Admitting: Family Medicine

## 2017-04-30 VITALS — BP 130/82 | HR 71 | Temp 97.0°F | Resp 16 | Ht 66.0 in | Wt 203.8 lb

## 2017-04-30 DIAGNOSIS — Z23 Encounter for immunization: Secondary | ICD-10-CM

## 2017-04-30 DIAGNOSIS — R739 Hyperglycemia, unspecified: Secondary | ICD-10-CM

## 2017-04-30 DIAGNOSIS — R635 Abnormal weight gain: Secondary | ICD-10-CM

## 2017-04-30 DIAGNOSIS — R5383 Other fatigue: Secondary | ICD-10-CM

## 2017-04-30 DIAGNOSIS — Z1239 Encounter for other screening for malignant neoplasm of breast: Secondary | ICD-10-CM

## 2017-04-30 DIAGNOSIS — Z0000 Encounter for general adult medical examination without abnormal findings: Secondary | ICD-10-CM

## 2017-04-30 DIAGNOSIS — Z1211 Encounter for screening for malignant neoplasm of colon: Secondary | ICD-10-CM | POA: Diagnosis not present

## 2017-04-30 DIAGNOSIS — E669 Obesity, unspecified: Secondary | ICD-10-CM | POA: Diagnosis not present

## 2017-04-30 DIAGNOSIS — Z1231 Encounter for screening mammogram for malignant neoplasm of breast: Secondary | ICD-10-CM | POA: Diagnosis not present

## 2017-04-30 DIAGNOSIS — Z113 Encounter for screening for infections with a predominantly sexual mode of transmission: Secondary | ICD-10-CM | POA: Insufficient documentation

## 2017-04-30 NOTE — Patient Instructions (Addendum)
Keep a diet diary for 5-7 days and bring to your visit. Drink WATER. Quit drinking SWEET TEA! Schedule Mammogram  Obesity, Adult Obesity is having too much body fat. If you have a BMI of 30 or more, you are obese. BMI is a number that explains how much body fat you have. Obesity is often caused by taking in (consuming) more calories than your body uses. Obesity can cause serious health problems. Changing your lifestyle can help to treat obesity. Follow these instructions at home: Eating and drinking   Follow advice from your doctor about what to eat and drink. Your doctor may tell you to: ? Cut down on (limit) fast foods, sweets, and processed snack foods. ? Choose low-fat options. For example, choose low-fat milk instead of whole milk. ? Eat 5 or more servings of fruits or vegetables every day. ? Eat at home more often. This gives you more control over what you eat. ? Choose healthy foods when you eat out. ? Learn what a healthy portion size is. A portion size is the amount of a certain food that is healthy for you to eat at one time. This is different for each person. ? Keep low-fat snacks available. ? Avoid sugary drinks. These include soda, fruit juice, iced tea that is sweetened with sugar, and flavored milk. ? Eat a healthy breakfast.  Drink enough water to keep your pee (urine) clear or pale yellow.  Do not go without eating for long periods of time (do not fast).  Do not go on popular or trendy diets (fad diets). Physical Activity  Exercise often, as told by your doctor. Ask your doctor: ? What types of exercise are safe for you. ? How often you should exercise.  Warm up and stretch before being active.  Do slow stretching after being active (cool down).  Rest between times of being active. Lifestyle  Limit how much time you spend in front of your TV, computer, or video game system (be less sedentary).  Find ways to reward yourself that do not involve food.  Limit  alcohol intake to no more than 1 drink a day for nonpregnant women and 2 drinks a day for men. One drink equals 12 oz of beer, 5 oz of wine, or 1 oz of hard liquor. General instructions  Keep a weight loss journal. This can help you keep track of: ? The food that you eat. ? The exercise that you do.  Take over-the-counter and prescription medicines only as told by your doctor.  Take vitamins and supplements only as told by your doctor.  Think about joining a support group. Your doctor may be able to help with this.  Keep all follow-up visits as told by your doctor. This is important. Contact a doctor if:  You cannot meet your weight loss goal after you have changed your diet and lifestyle for 6 weeks. This information is not intended to replace advice given to you by your health care provider. Make sure you discuss any questions you have with your health care provider. Document Released: 04/01/2011 Document Revised: 06/15/2015 Document Reviewed: 10/26/2014 Elsevier Interactive Patient Education  2018 [employer].

## 2017-04-30 NOTE — Progress Notes (Signed)
Patient ID: Sara Washington, female    DOB: August 25, 1965, 52 y.o.   MRN: [id number]  Chief Complaint  Patient presents with  . Establish Care    Allergies Patient has no known allergies.  Subjective:   Sara Washington is a 52 y.o. female who presents to [facility] today.  HPI Sara Washington presents as a new patient visit to establish care.  She has lived in [place] for over 20 years after moving from [place].  She has recently gotten insurance and comes in to establish care.  She works as a Training and development officer at [employer].  She enjoys her job but reports she is on her feet all day.  She reports that she has had several friends tell her lately that she has gained a good bit of weight.  She reports that she does not know why she is gained weight because she does not eat very much.  She reports that she has been tired recently.  She reports that her mood is good.  She sleeps well.  Reports her bowel movements are normal.  Denies any melena or bright red blood per rectum.  No change in her bowel patterns.  She is past due for colon cancer screening.  She has never had a colonoscopy or Colo guard testing.  She is not sexually active at this time.  She would like to be screened for any sexually transmitted infections.  She reports that she is previously just gone to the emergency department any time she has needed a doctor.  She reports that she has not had a mammogram performed in over 10 years.  She would like to get her tetanus shot.  She did have a hysterectomy due to heavy periods over 20 years ago.  She had her ovaries and uterus removed at that time.  She denies any subsequent vaginal bleeding.  She denies any history of postcoital bleeding.  She has never been on hormone replacement therapy.  She would like to come back and get a complete physical.  Review of her chart from the emergency department does reveal that she has had some high blood sugars in the past.  She has also had greater than  40 pound weight gain over the past 3 months.  She reports that she had noticed that her clothes were getting much tighter and her friends kept telling her she was putting on weight.  She denies any vaginal discharge or pelvic pain.   Past Medical History:  Diagnosis Date  . Carpal tunnel syndrome   . Migraine     Past Surgical History:  Procedure Laterality Date  . ABDOMINAL HYSTERECTOMY     due to heavy menses; no cancer; ovaries and uterus removed.   . CESAREAN SECTION      Family History  Problem Relation Age of Onset  . Diabetes Mother   . Hypertension Brother   . Hypertension Sister      Social History   Socioeconomic History  . Marital status: Single    Spouse name: Not on file  . Number of children: Not on file  . Years of education: Not on file  . Highest education level: Not on file  Occupational History  . Not on file  Social Needs  . Financial resource strain: Not on file  . Food insecurity:    Worry: Not on file    Inability: Not on file  . Transportation needs:    Medical: Not on file  Non-medical: Not on file  Tobacco Use  . Smoking status: Never Smoker  . Smokeless tobacco: Never Used  Substance and Sexual Activity  . Alcohol use: No  . Drug use: No  . Sexual activity: Not Currently    Partners: Male    Birth control/protection: Surgical  Lifestyle  . Physical activity:    Days per week: 5 days    Minutes per session: 30 min  . Stress: Not at all  Relationships  . Social connections:    Talks on phone: Not on file    Gets together: Not on file    Attends religious service: Not on file    Active member of club or organization: Not on file    Attends meetings of clubs or organizations: Not on file    Relationship status: Not on file  Other Topics Concern  . Not on file  Social History Narrative   Grew up in [place]. Has a daughter and 2 grandchildren that live here. Single, not dating.    Has two daughters, one lives in [place].     Work at [employer] as a Training and development officer.   Wear seatbelt.   Exercise by walking.   Eats all food groups.    Review of Systems  Constitutional: Positive for fatigue and unexpected weight change. Negative for activity change, appetite change, chills and fever.  HENT: Negative for trouble swallowing and voice change.   Eyes: Negative for visual disturbance.  Respiratory: Negative for cough, chest tightness and shortness of breath.   Cardiovascular: Negative for chest pain, palpitations and leg swelling.  Gastrointestinal: Negative for abdominal distention, abdominal pain, anal bleeding, blood in stool, diarrhea, nausea and vomiting.  Endocrine: Negative for polyphagia and polyuria.  Genitourinary: Negative for difficulty urinating, dysuria, frequency, urgency, vaginal bleeding, vaginal discharge and vaginal pain.  Musculoskeletal: Negative for arthralgias and myalgias.  Skin: Negative for rash.  Neurological: Negative for dizziness, syncope and light-headedness.  Hematological: Negative for adenopathy.  Psychiatric/Behavioral: Negative for behavioral problems, dysphoric mood, sleep disturbance and suicidal ideas. The patient is not nervous/anxious.      Objective:   BP 130/82 (BP Location: Left Arm, Patient Position: Sitting, Cuff Size: Normal)   Pulse 71   Temp (!) 97 F (36.1 C) (Temporal)   Resp 16   Ht 5\' 6"  (1.676 m)   Wt 203 lb 12 oz (92.4 kg)   SpO2 97%   BMI 32.89 kg/m   Physical Exam  Constitutional: She is oriented to person, place, and time. She appears well-developed and well-nourished. No distress.  HENT:  Head: Normocephalic and atraumatic.  Eyes: Pupils are equal, round, and reactive to light. No scleral icterus.  Neck: Normal range of motion. Neck supple. No JVD present. No tracheal deviation present. No thyromegaly present.  Cardiovascular: Normal rate, regular rhythm and normal heart sounds.  Pulmonary/Chest: Effort normal and breath sounds normal. No  respiratory distress.  Abdominal: Soft. Bowel sounds are normal. She exhibits no distension. There is no tenderness.  Lymphadenopathy:    She has no cervical adenopathy.  Neurological: She is alert and oriented to person, place, and time. No cranial nerve deficit.  Skin: Skin is warm and dry.  Psychiatric: She has a normal mood and affect. Her behavior is normal. Judgment and thought content normal.  Nursing note and vitals reviewed.  Depression screen PHQ 2/9 04/30/2017  Decreased Interest 0  Down, Depressed, Hopeless 0  PHQ - 2 Score 0    Assessment and Plan  1.  Weight gain Patient has had over 40 pound weight gain in the past 6 months.  She is unsure if this is related to her diet or metabolic disturbance.  She denies overeating or poor diet.  Today diet recommendations were discussed in detail.  She is encouraged to keep a 7-day diet recall and bring to her next visit.  Will screen for metabolic disturbances today.  Exercise and weight loss recommendations discussed.  We also discussed that her BMI places her in the category of obesity.  2. Colon cancer screening Colonoscopy versus [place] Glass guard testing was discussed today.  At this time patient wishes to proceed with Colo guard.  She has no family history of colon cancer.  She has no personal history of colonoscopy or history of adenomas.  She is asymptomatic at this time.  Information given and counseling given on testing today. - Cologuard  3. Screening for breast cancer Mammogram screening ordered.  We will perform clinical breast exam at physical. - MM Digital Screening; Future  4. Immunization due .  Vaccination administered - Tdap vaccine greater than or equal to 7yo IM  5. Screen for STD (sexually transmitted disease) - HIV antibody (with reflex) - Hepatitis C antibody - RPR - Hepatitis panel, acute - Urine cytology ancillary only  6. Hyperglycemia Review of chart reveals history of hyperglycemia in the past.  Screen  for diabetes today.  Weight gain and risk of diabetes discussed today.  Dietary modifications recommended. - Hemoglobin A1c  7. Well adult exam Patient will return to clinic for complete physical exam including clinical breast exam and GYN exam.  Will obtain lipid panel today. - Lipid panel  8. Fatigue, unspecified type Check labs today secondary to fatigue and weight gain. - TSH - VITAMIN D 25 Hydroxy (Vit-D Deficiency, Fractures) - Vitamin B12 - COMPLETE METABOLIC PANEL WITH GFR  9. Obesity (BMI 30-39.9) Lifestyle modifications discussed with patient including a diet emphasizing vegetables, fruits, and whole grains. Limiting intake of sodium to less than 2,400 mg per day.  Recommendations discussed include consuming low-fat dairy products, poultry, fish, legumes, non-tropical vegetable oils, and nuts; and limiting intake of sweets, sugar-sweetened beverages, and red meat. Discussed following a plan such as the Dietary Approaches to Stop Hypertension (DASH) diet. Patient to read up on this diet.  The patient is asked to make an attempt to improve diet and exercise patterns to aid in medical management of this problem. Office visit was greater than 30 minutes.  Greater than 50% of office visit spent counseling and coordinating care.  Today we discussed preventive health care, obesity, diet, exercise, and weight loss recommendations.  We discussed screening test to rule out any metabolic disorder which could be contributing to her weight gain.  She is asked to keep a diet diary and bring into her next visit.  She will follow-up and we will perform a clinical breast exam and a complete physical examination.  We will go ahead and proceed with colon cancer screening at this time.  She was counseled on the test.  Will screen her for diabetes at this time.  We discussed obesity and risk of diabetes.  She was encouraged to keep her scheduled follow-up.  Records requested from previous physician in  [place], patient is going to try to get the address. Return in about 3 months (around 07/30/2017) for CPE. Caren Macadam, MD 05/01/2017

## 2017-05-01 LAB — URINE CYTOLOGY ANCILLARY ONLY
Chlamydia: NEGATIVE
Neisseria Gonorrhea: NEGATIVE
TRICH (WINDOWPATH): NEGATIVE

## 2017-05-05 LAB — COMPLETE METABOLIC PANEL WITH GFR
AG Ratio: 1.9 (calc) (ref 1.0–2.5)
ALBUMIN MSPROF: 4.4 g/dL (ref 3.6–5.1)
ALKALINE PHOSPHATASE (APISO): 83 U/L (ref 33–130)
ALT: 17 U/L (ref 6–29)
AST: 18 U/L (ref 10–35)
BUN: 15 mg/dL (ref 7–25)
CO2: 29 mmol/L (ref 20–32)
CREATININE: 0.85 mg/dL (ref 0.50–1.05)
Calcium: 9.6 mg/dL (ref 8.6–10.4)
Chloride: 104 mmol/L (ref 98–110)
GFR, Est African American: 90 mL/min/{1.73_m2} (ref >=60)
GFR, Est Non African American: 78 mL/min/{1.73_m2} (ref >=60)
GLUCOSE: 104 mg/dL — AB (ref 65–99)
Globulin: 2.3 g/dL (calc) (ref 1.9–3.7)
Potassium: 4.4 mmol/L (ref 3.5–5.3)
Sodium: 138 mmol/L (ref 135–146)
Total Bilirubin: 0.7 mg/dL (ref 0.2–1.2)
Total Protein: 6.7 g/dL (ref 6.1–8.1)

## 2017-05-05 LAB — RPR: RPR: NONREACTIVE

## 2017-05-05 LAB — TSH: TSH: 1.5 mIU/L

## 2017-05-05 LAB — HEPATITIS PANEL, ACUTE
HEP A IGM: NONREACTIVE
HEP B C IGM: NONREACTIVE
HEP B S AG: NONREACTIVE
HEP C AB: NONREACTIVE
SIGNAL TO CUT-OFF: 0.01 (ref <1.00)

## 2017-05-05 LAB — HEMOGLOBIN A1C
EAG (MMOL/L): 5.8 (calc)
Hgb A1c MFr Bld: 5.3 % of total Hgb (ref <5.7)
MEAN PLASMA GLUCOSE: 105 (calc)

## 2017-05-05 LAB — LIPID PANEL
Cholesterol: 235 mg/dL — ABNORMAL HIGH (ref <200)
HDL: 65 mg/dL (ref >50)
LDL Cholesterol (Calc): 153 mg/dL (calc) — ABNORMAL HIGH
Non-HDL Cholesterol (Calc): 170 mg/dL (calc) — ABNORMAL HIGH (ref <130)
TRIGLYCERIDES: 71 mg/dL (ref <150)
Total CHOL/HDL Ratio: 3.6 (calc) (ref <5.0)

## 2017-05-05 LAB — HIV ANTIBODY (ROUTINE TESTING W REFLEX): HIV 1&2 Ab, 4th Generation: NONREACTIVE

## 2017-05-05 LAB — VITAMIN B12: Vitamin B-12: 313 pg/mL (ref 200–1100)

## 2017-05-05 LAB — VITAMIN D 25 HYDROXY (VIT D DEFICIENCY, FRACTURES): Vit D, 25-Hydroxy: 14 ng/mL — ABNORMAL LOW (ref 30–100)

## 2017-05-06 ENCOUNTER — Encounter: Payer: Self-pay | Admitting: Family Medicine

## 2017-06-13 ENCOUNTER — Ambulatory Visit: Payer: BC Managed Care – PPO | Admitting: Family Medicine

## 2017-06-26 ENCOUNTER — Encounter: Payer: Self-pay | Admitting: Family Medicine

## 2017-06-27 ENCOUNTER — Encounter: Payer: Self-pay | Admitting: Family Medicine

## 2017-07-02 ENCOUNTER — Encounter: Payer: Self-pay | Admitting: Family Medicine

## 2017-07-02 ENCOUNTER — Ambulatory Visit (INDEPENDENT_AMBULATORY_CARE_PROVIDER_SITE_OTHER): Payer: BC Managed Care – PPO | Admitting: Family Medicine

## 2017-07-02 ENCOUNTER — Other Ambulatory Visit: Payer: Self-pay

## 2017-07-02 VITALS — BP 134/84 | HR 68 | Temp 98.3°F | Resp 16 | Ht 66.0 in | Wt 208.0 lb

## 2017-07-02 DIAGNOSIS — R110 Nausea: Secondary | ICD-10-CM

## 2017-07-02 DIAGNOSIS — N390 Urinary tract infection, site not specified: Secondary | ICD-10-CM | POA: Diagnosis not present

## 2017-07-02 MED ORDER — SULFAMETHOXAZOLE-TRIMETHOPRIM 800-160 MG PO TABS: 1.0000 | ORAL_TABLET | Freq: Two times a day (BID) | ORAL | 0 refills | Status: DC

## 2017-07-02 NOTE — Progress Notes (Signed)
Patient ID: Sara Washington, female    DOB: 1965/09/17, 52 y.o.   MRN: [id number]  Chief Complaint  Patient presents with  . Obesity    follow up on diet  . Nausea    Allergies Patient has no known allergies.  Subjective:   Sara Washington is a 52 y.o. female who presents to [facility] today.  HPI Here for follow up. Reports that despite the fact that she was going to try to loose weight, has actually gain some weight.  Reports had a stressful past couple weeks and ended up having to move.  Reports that everything is fine at this time.  Is going to get back on track with her exercise and eating healthy.  She comes in today because she would like to discuss the fact that she has been feeling nauseated at times over the past several weeks.  Reports that when she wakes up in the morning she feels nauseated and then takes OTC emmetrol and it goes away. No belching or burping. Has been having some acid reflux from time to time. Does get some pain in the left side and the small of her back from time to time. No dyuria. Urine does not have any odor. Nausea has been going on and off for a couple months.  Has noticed that she has had some urinary frequency over the past month or so.  Denies any vaginal discharge.  No abdominal pain.  Bowel movements normal.  Does not feel constipated.  Is not having any epigastric pain.  Denies any fevers, vomiting, diarrhea, night sweats.  No chest pain, palpitations, shortness of breath, or syncope.  Is not bothered by any heartburn.  Does not wake up coughing.  Mood is good.  Patient has had a complete hysterectomy years ago.  She denies any pelvic pain or fullness.  No tenderness over her bladder.  Has had her ovaries removed.     Past Medical History:  Diagnosis Date  . Carpal tunnel syndrome   . Migraine     Past Surgical History:  Procedure Laterality Date  . ABDOMINAL HYSTERECTOMY     due to heavy menses; no cancer; ovaries and uterus removed.    . CESAREAN SECTION      Family History  Problem Relation Age of Onset  . Diabetes Mother   . Hypertension Brother   . Hypertension Sister      Social History   Socioeconomic History  . Marital status: Single    Spouse name: Not on file  . Number of children: Not on file  . Years of education: Not on file  . Highest education level: Not on file  Occupational History  . Not on file  Social Needs  . Financial resource strain: Not on file  . Food insecurity:    Worry: Not on file    Inability: Not on file  . Transportation needs:    Medical: Not on file    Non-medical: Not on file  Tobacco Use  . Smoking status: Never Smoker  . Smokeless tobacco: Never Used  Substance and Sexual Activity  . Alcohol use: No  . Drug use: No  . Sexual activity: Not Currently    Partners: Male    Birth control/protection: Surgical  Lifestyle  . Physical activity:    Days per week: 5 days    Minutes per session: 30 min  . Stress: Not at all  Relationships  . Social connections:    Talks  on phone: Not on file    Gets together: Not on file    Attends religious service: Not on file    Active member of club or organization: Not on file    Attends meetings of clubs or organizations: Not on file    Relationship status: Not on file  Other Topics Concern  . Not on file  Social History Narrative   Grew up in [place]. Has a daughter and 2 grandchildren that live here. Single, not dating.    Has two daughters, one lives in [place].    Work at [employer] as a Training and development officer.   Wear seatbelt.   Exercise by walking.   Eats all food groups.    Review of Systems  Constitutional: Negative for activity change, appetite change, fatigue and unexpected weight change.  HENT: Negative for mouth sores, nosebleeds, postnasal drip, sneezing, sore throat, tinnitus, trouble swallowing and voice change.   Eyes: Negative for visual disturbance.  Respiratory: Negative for cough, chest tightness and  shortness of breath.   Gastrointestinal: Positive for nausea. Negative for abdominal distention, abdominal pain, blood in stool, constipation, diarrhea, rectal pain and vomiting.  Endocrine: Positive for polyuria.  Genitourinary: Negative for dyspareunia, hematuria and pelvic pain.  Neurological: Negative for dizziness, tremors, weakness, numbness and headaches.  Psychiatric/Behavioral: Negative for agitation, dysphoric mood, sleep disturbance and suicidal ideas. The patient is not nervous/anxious.      Objective:   BP 134/84 (BP Location: Left Arm, Patient Position: Sitting, Cuff Size: Large)   Pulse 68   Temp 98.3 F (36.8 C) (Temporal)   Resp 16   Ht 5\' 6"  (1.676 m)   Wt 208 lb 0.6 oz (94.4 kg)   SpO2 97%   BMI 33.58 kg/m   Physical Exam  Constitutional: She is oriented to person, place, and time. She appears well-developed and well-nourished. No distress.  HENT:  Head: Normocephalic and atraumatic.  Eyes: Pupils are equal, round, and reactive to light. Conjunctivae are normal.  Neck: Normal range of motion. Neck supple. No thyromegaly present.  Cardiovascular: Normal rate, regular rhythm and normal heart sounds.  Pulmonary/Chest: Effort normal and breath sounds normal. No respiratory distress. She has no wheezes.  Abdominal: Soft. Bowel sounds are normal. She exhibits no distension. There is no tenderness. There is no guarding.  No CVA tenderness to palpation.  Musculoskeletal:  No low back tenderness to palpation.  Negative straight leg raise.  Strength 5 out of 5 in upper and lower extremities.  Neurological: She is alert and oriented to person, place, and time. No cranial nerve deficit.  Skin: Skin is warm and dry.  Psychiatric: She has a normal mood and affect. Her behavior is normal. Judgment and thought content normal.  Nursing note and vitals reviewed.    Assessment and Plan  1. Nausea Patient with nausea over the past 1 to 2 months.  Uncertain etiology.  Had  recent lab testing with CMP, CBC, screening for sexually transmitted infection, HIV, syphilis, and hepatitis which were all within normal limits. Discussed with patient that her symptoms could be possibly due to a urinary tract infection versus reflux.  Will treat for urinary tract infection at this time.  Will if symptoms are not resolved in 2 weeks she will call and we will try PPI.  If symptoms are not resolved after treatment with PPI or at any time if they do worsen or if she develops any worrisome signs and symptoms she is to call or return to clinic.  She was also counseled regarding worrisome signs of abdominal pain.  If those do occur she should go to the emergency department.  If her symptoms are not improved or resolved we will consider additional lab work and referral to gastroenterology. - POCT urinalysis dipstick  2. Urinary tract infection without hematuria, site unspecified Treat for UTI at this time.  Will await culture.  Start antibiotics at this time.  Call with any questions, concerns, or worrisome symptoms - Urine Culture - sulfamethoxazole-trimethoprim (BACTRIM DS,SEPTRA DS) 800-160 MG tablet; Take 1 tablet by mouth 2 (two) times daily.  Dispense: 14 tablet; Refill: 0   Diet, exercise, reflux precautions, and weight loss modifications recommended. No follow-ups on file. Tod Persia, [place] 07/02/2017

## 2017-07-03 LAB — POCT URINALYSIS DIPSTICK
Bilirubin, UA: NEGATIVE
Blood, UA: NEGATIVE
Glucose, UA: NEGATIVE
Ketones, UA: NEGATIVE
NITRITE UA: NEGATIVE
PH UA: 7 (ref 5.0–8.0)
Protein, UA: NEGATIVE
Spec Grav, UA: 1.02 (ref 1.010–1.025)
UROBILINOGEN UA: 0.2 U/dL

## 2017-07-03 LAB — URINE CULTURE
MICRO NUMBER:: 90704941
SPECIMEN QUALITY: ADEQUATE

## 2017-07-10 ENCOUNTER — Telehealth: Payer: Self-pay | Admitting: Family Medicine

## 2017-07-10 DIAGNOSIS — R110 Nausea: Secondary | ICD-10-CM

## 2017-07-10 NOTE — Telephone Encounter (Signed)
Patient called in that she completed her sulfamethoxazole prescription, the medication helped her up until yesterday. She started feeling nauseated again. Cb#: [id number]/ [phone]

## 2017-07-14 MED ORDER — PANTOPRAZOLE SODIUM 40 MG PO TBEC: 40.0000 mg | DELAYED_RELEASE_TABLET | Freq: Every day | ORAL | 3 refills | Status: DC

## 2017-07-14 NOTE — Telephone Encounter (Signed)
lvm returning patient's call

## 2017-07-14 NOTE — Telephone Encounter (Signed)
Please call patient and see what is going on. I am not sure why she is still nauseated. The urine did not grow out bacteria consistent with an infection. We can try the acid medication and I will go ahead and put in a referral to GI.  I have sent in the acid medication to her pharmacy.  Gwen Her. Mannie Stabile, MD

## 2017-07-16 NOTE — Telephone Encounter (Signed)
Called patient to discuss Dr.Hagler's recommendations. No answer. Left message requesting call back.

## 2017-07-21 ENCOUNTER — Encounter: Payer: Self-pay | Admitting: Gastroenterology

## 2017-07-22 NOTE — Telephone Encounter (Signed)
Left message requesting call back to discuss Dr.Hagler's recommendations. 3rd attempt.

## 2017-10-15 ENCOUNTER — Other Ambulatory Visit: Payer: Self-pay | Admitting: *Deleted

## 2017-10-15 ENCOUNTER — Encounter: Payer: Self-pay | Admitting: Family Medicine

## 2017-10-15 ENCOUNTER — Ambulatory Visit (INDEPENDENT_AMBULATORY_CARE_PROVIDER_SITE_OTHER): Payer: BC Managed Care – PPO | Admitting: Family Medicine

## 2017-10-15 ENCOUNTER — Ambulatory Visit (INDEPENDENT_AMBULATORY_CARE_PROVIDER_SITE_OTHER): Payer: BC Managed Care – PPO | Admitting: Gastroenterology

## 2017-10-15 ENCOUNTER — Encounter: Payer: Self-pay | Admitting: *Deleted

## 2017-10-15 ENCOUNTER — Other Ambulatory Visit: Payer: Self-pay

## 2017-10-15 ENCOUNTER — Encounter: Payer: Self-pay | Admitting: Gastroenterology

## 2017-10-15 VITALS — BP 132/86 | HR 72 | Temp 97.0°F | Ht 66.0 in | Wt 208.2 lb

## 2017-10-15 VITALS — BP 120/80 | HR 73 | Temp 98.2°F | Resp 12 | Ht 66.0 in | Wt 209.1 lb

## 2017-10-15 DIAGNOSIS — M799 Soft tissue disorder, unspecified: Secondary | ICD-10-CM | POA: Diagnosis not present

## 2017-10-15 DIAGNOSIS — Z1211 Encounter for screening for malignant neoplasm of colon: Secondary | ICD-10-CM

## 2017-10-15 DIAGNOSIS — R110 Nausea: Secondary | ICD-10-CM | POA: Diagnosis not present

## 2017-10-15 DIAGNOSIS — D649 Anemia, unspecified: Secondary | ICD-10-CM

## 2017-10-15 DIAGNOSIS — Z23 Encounter for immunization: Secondary | ICD-10-CM | POA: Diagnosis not present

## 2017-10-15 LAB — CBC WITH DIFFERENTIAL/PLATELET
Basophils Absolute: 21 {cells}/uL (ref 0–200)
Basophils Relative: 0.5 %
Eosinophils Absolute: 82 {cells}/uL (ref 15–500)
Eosinophils Relative: 2 %
HCT: 37.7 % (ref 35.0–45.0)
Hemoglobin: 12.4 g/dL (ref 11.7–15.5)
Lymphs Abs: 1730 {cells}/uL (ref 850–3900)
MCH: 27.1 pg (ref 27.0–33.0)
MCHC: 32.9 g/dL (ref 32.0–36.0)
MCV: 82.3 fL (ref 80.0–100.0)
MPV: 10 fL (ref 7.5–12.5)
Monocytes Relative: 7.7 %
Neutro Abs: 1952 {cells}/uL (ref 1500–7800)
Neutrophils Relative %: 47.6 %
Platelets: 285 Thousand/uL (ref 140–400)
RBC: 4.58 Million/uL (ref 3.80–5.10)
RDW: 11.7 % (ref 11.0–15.0)
Total Lymphocyte: 42.2 %
WBC mixed population: 316 {cells}/uL (ref 200–950)
WBC: 4.1 Thousand/uL (ref 3.8–10.8)

## 2017-10-15 LAB — IRON,TIBC AND FERRITIN PANEL
%SAT: 37 % (ref 16–45)
Ferritin: 148 ng/mL (ref 16–232)
Iron: 122 ug/dL (ref 45–160)
TIBC: 329 ug/dL (ref 250–450)

## 2017-10-15 MED ORDER — PANTOPRAZOLE SODIUM 40 MG PO TBEC: 40.0000 mg | DELAYED_RELEASE_TABLET | Freq: Every day | ORAL | 3 refills | Status: AC

## 2017-10-15 MED ORDER — PEG 3350-KCL-NA BICARB-NACL 420 G PO SOLR: 4000.0000 mL | Freq: Once | ORAL | 0 refills | Status: AC

## 2017-10-15 NOTE — Progress Notes (Signed)
Patient ID: Sara Washington, female    DOB: 03-17-1965, 52 y.o.   MRN: [id number]  Chief Complaint  Patient presents with  . knot on back of thigh    right thigh. not painful    Allergies Patient has no known allergies.  Subjective:   Sara Washington is a 52 y.o. female who presents to [facility] today.  HPI Patient comes in today because has had a knot on the back of her right thigh for quite some time.  Was seen at the GI doctor this morning and walked up to our office to see if we could work her in for this visit.  Reports that believes the area in the back of her thigh has increased in size.  Was talking with a friend about it the other week and they told her it could be a boil.  The area is not sore.  Patient can feel that it is there and can feel that it is deep.  Believes that it is increased in size.  Has not been red.  Has not had any overlying skin changes.  Has not drained any fluid.  Patient has not had any numbness or tingling in her extremities.  Can move all her extremities.  Is not bothering her other than she knows it is there and would like to have it removed.  Has not had any imaging studies of this area.  Has not had any fevers, chills, or weight loss.  She does not smoke.  Works in [employer].  Would like to get a flu shot today.  Does not have any concerns today other than this.   Past Medical History:  Diagnosis Date  . Carpal tunnel syndrome   . Migraine     Past Surgical History:  Procedure Laterality Date  . ABDOMINAL HYSTERECTOMY     due to heavy menses; no cancer; ovaries and uterus removed.   . CESAREAN SECTION      Family History  Problem Relation Age of Onset  . Diabetes Mother   . Hypertension Brother   . Hypertension Sister   . Colon cancer Neg Hx   . Colon polyps Neg Hx      Social History   Socioeconomic History  . Marital status: Single    Spouse name: Not on file  . Number of children: Not on file  . Years of  education: Not on file  . Highest education level: Not on file  Occupational History    Comment: cook, now at [employer]  . Financial resource strain: Not on file  . Food insecurity:    Worry: Not on file    Inability: Not on file  . Transportation needs:    Medical: Not on file    Non-medical: Not on file  Tobacco Use  . Smoking status: Never Smoker  . Smokeless tobacco: Never Used  Substance and Sexual Activity  . Alcohol use: No  . Drug use: No  . Sexual activity: Not Currently    Partners: Male    Birth control/protection: Surgical  Lifestyle  . Physical activity:    Days per week: 5 days    Minutes per session: 30 min  . Stress: Not at all  Relationships  . Social connections:    Talks on phone: Not on file    Gets together: Not on file    Attends religious service: Not on file    Active member of club or  organization: Not on file    Attends meetings of clubs or organizations: Not on file    Relationship status: Not on file  Other Topics Concern  . Not on file  Social History Narrative   Grew up in [place]. Has a daughter and 2 grandchildren that live here. Single, not dating.    Has two daughters, one lives in [place].    Work at [employer] as a Training and development officer.   Wear seatbelt.   Exercise by walking.   Eats all food groups.   Current Outpatient Medications on File Prior to Visit  Medication Sig Dispense Refill  . ibuprofen (ADVIL,MOTRIN) 600 MG tablet Take 1 tablet (600 mg total) by mouth 3 (three) times daily with meals as needed. 30 tablet 0  . naproxen sodium (ALEVE) 220 MG tablet Take 660 mg by mouth as needed.    . pantoprazole (PROTONIX) 40 MG tablet Take 1 tablet (40 mg total) by mouth daily. Take 30 minutes before breakfast 90 tablet 3  . polyethylene glycol-electrolytes (NULYTELY/GOLYTELY) 420 g solution Take 4,000 mLs by mouth once for 1 dose. 4000 mL 0   No current facility-administered medications on file prior to  visit.     Review of Systems  Constitutional: Negative for activity change, appetite change, chills, fatigue, fever and unexpected weight change.  Respiratory: Negative for cough and shortness of breath.   Cardiovascular: Negative for chest pain, palpitations and leg swelling.  Musculoskeletal: Negative for arthralgias, gait problem and myalgias.  Skin: Negative for color change, rash and wound.  Neurological: Negative for weakness and numbness.     Objective:   BP 120/80 (BP Location: Right Arm, Patient Position: Sitting, Cuff Size: Large)   Pulse 73   Temp 98.2 F (36.8 C) (Temporal)   Resp 12   Ht 5\' 6"  (1.676 m)   Wt 209 lb 1.9 oz (94.9 kg)   SpO2 95% Comment: room air  BMI 33.75 kg/m   Physical Exam  Constitutional: She is oriented to person, place, and time. She appears well-developed and well-nourished.  HENT:  Head: Normocephalic and atraumatic.  Eyes: Pupils are equal, round, and reactive to light.  Neck: Normal range of motion. Neck supple.  Cardiovascular: Normal rate, regular rhythm and normal heart sounds.  Musculoskeletal: Normal range of motion. She exhibits no edema.  Neurological: She is alert and oriented to person, place, and time. No cranial nerve deficit.  Skin: Skin is warm and dry. Capillary refill takes less than 2 seconds. No rash noted.  Right posterior upper thigh, palpable subcutaneous lesion, borders difficult to determine, but can palpate at least 3 cm in diameter.   Vitals reviewed.     Assessment and Plan  1. Soft tissue lesion Discussed with patient that I suspect this is a lipoma versus cystic lesion.  She would like to go ahead and have this evaluated by surgery and consider having it removed.  We will go ahead and place that referral today.  Patient was counseled regarding worrisome signs and symptoms regarding this area. - Ambulatory referral to General Surgery  2. Need for immunization against influenza - Flu Vaccine QUAD 36+ mos  IM  Patient was told to schedule follow-up with new PCP physician in the next several months.  She was told to call with any questions or concerns. No follow-ups on file. Caren Macadam, MD 10/15/2017

## 2017-10-15 NOTE — Progress Notes (Addendum)
REVIEWED-NO ADDITIONAL RECOMMENDATIONS.  Primary Care Physician:  Caren Macadam, MD Primary Gastroenterologist:  Dr. Oneida Alar   Chief Complaint  Patient presents with  . Nausea  . Abdominal Pain    HPI:   Sara Washington is a 52 y.o. female presenting today at the request of Dr. Mannie Stabile due to nausea.   Nausea for several months. Intermittent. Difficult to describe. Sometimes postprandial, sometimes unrelated to eating. No vomiting. Sometimes lower abdominal discomfort, no triggers or warning signs. Relieved on its own. NSAIDs daily for headaches.   No changes in bowel habits. No rectal bleeding. Has chronic reflux. Was prescribed Protonix and took for about 1-2 weeks.  Sometimes felt it worked, sometimes not. Sometimes early satiety, sometimes no appetite. No weight loss. No dysphagia.   No prior colonoscopy/EGD.   Past Medical History:  Diagnosis Date  . Carpal tunnel syndrome   . Migraine     Past Surgical History:  Procedure Laterality Date  . ABDOMINAL HYSTERECTOMY     due to heavy menses; no cancer; ovaries and uterus removed.   . CESAREAN SECTION      Current Outpatient Medications  Medication Sig Dispense Refill  . ibuprofen (ADVIL,MOTRIN) 600 MG tablet Take 1 tablet (600 mg total) by mouth 3 (three) times daily with meals as needed. 30 tablet 0  . naproxen sodium (ALEVE) 220 MG tablet Take 660 mg by mouth as needed.    . pantoprazole (PROTONIX) 40 MG tablet Take 1 tablet (40 mg total) by mouth daily. Take 30 minutes before breakfast 90 tablet 3   No current facility-administered medications for this visit.     Allergies as of 10/15/2017  . (No Known Allergies)    Family History  Problem Relation Age of Onset  . Diabetes Mother   . Hypertension Brother   . Hypertension Sister   . Colon cancer Neg Hx   . Colon polyps Neg Hx     Social History   Socioeconomic History  . Marital status: Single    Spouse name: Not on file  . Number of children: Not on file    . Years of education: Not on file  . Highest education level: Not on file  Occupational History    Comment: cook, now at [employer]  . Financial resource strain: Not on file  . Food insecurity:    Worry: Not on file    Inability: Not on file  . Transportation needs:    Medical: Not on file    Non-medical: Not on file  Tobacco Use  . Smoking status: Never Smoker  . Smokeless tobacco: Never Used  Substance and Sexual Activity  . Alcohol use: No  . Drug use: No  . Sexual activity: Not Currently    Partners: Male    Birth control/protection: Surgical  Lifestyle  . Physical activity:    Days per week: 5 days    Minutes per session: 30 min  . Stress: Not at all  Relationships  . Social connections:    Talks on phone: Not on file    Gets together: Not on file    Attends religious service: Not on file    Active member of club or organization: Not on file    Attends meetings of clubs or organizations: Not on file    Relationship status: Not on file  . Intimate partner violence:    Fear of current or ex partner: Not on file    Emotionally abused: Not on file  Physically abused: Not on file    Forced sexual activity: Not on file  Other Topics Concern  . Not on file  Social History Narrative   Grew up in [place]. Has a daughter and 2 grandchildren that live here. Single, not dating.    Has two daughters, one lives in [place].    Work at [employer] as a Training and development officer.   Wear seatbelt.   Exercise by walking.   Eats all food groups.    Review of Systems: Gen: Denies any fever, chills, fatigue, weight loss, lack of appetite.  CV: Denies chest pain, heart palpitations, peripheral edema, syncope.  Resp: Denies shortness of breath at rest or with exertion. Denies wheezing or cough.  GI: see HPI  GU : Denies urinary burning, urinary frequency, urinary hesitancy MS: Denies joint pain, muscle weakness, cramps, or limitation of movement.  Derm:  Denies rash, itching, dry skin Psych: Denies depression, anxiety, memory loss, and confusion Heme: Denies bruising, bleeding, and enlarged lymph nodes.  Physical Exam: BP 132/86   Pulse 72   Temp (!) 97 F (36.1 C) (Oral)   Ht 5\' 6"  (1.676 m)   Wt 208 lb 3.2 oz (94.4 kg)   BMI 33.60 kg/m  General:   Alert and oriented. Pleasant and cooperative. Well-nourished and well-developed.  Head:  Normocephalic and atraumatic. Eyes:  Without icterus, sclera clear and conjunctiva pink.  Ears:  Normal auditory acuity. Nose:  No deformity, discharge,  or lesions. Mouth:  No deformity or lesions, oral mucosa pink.  Lungs:  Clear to auscultation bilaterally. No wheezes, rales, or rhonchi. No distress.  Heart:  S1, S2 present without murmurs appreciated.  Abdomen:  +BS, soft, mild TTP epigastric and non-distended. No HSM noted. No guarding or rebound. No masses appreciated.  Rectal:  Deferred  Msk:  Symmetrical without gross deformities. Normal posture. Extremities:  Without edema. Neurologic:  Alert and  oriented x4 Psych:  Alert and cooperative. Normal mood and affect.  Lab Results  Component Value Date   WBC 3.7 (L) 03/13/2017   HGB 11.8 (L) 03/13/2017   HCT 39.0 03/13/2017   MCV 86.7 03/13/2017   PLT 254 03/13/2017

## 2017-10-15 NOTE — Progress Notes (Signed)
CC'D TO PCP °

## 2017-10-15 NOTE — Assessment & Plan Note (Signed)
52 year old female with several month history of nausea without obvious exacerbating or relieving factors. Notes chronic GERD and takes NSAIDs daily; no PPI. Likely dealing with gastritis, unable to exclude PUD, needs daily PPI therapy. No alarm symptoms, but she does have normocytic anemia noted recently. Will check CBC, iron studies. If evidence of IDA, pursue EGD at time of screening colonoscopy. Risks and benefits discussed with stated understanding. Protonix sent to pharmacy.

## 2017-10-15 NOTE — Patient Instructions (Addendum)
I have started you back on Protonix to take once each morning, 30 minutes before breakfast.   Please have blood work done today.   We have scheduled you for a colonoscopy and possible upper endoscopy with Dr. Oneida Alar in the near future.  We will see you in 3 months!  It was a pleasure to see you today. I strive to create trusting relationships with patients to provide genuine, compassionate, and quality care. I value your feedback. If you receive a survey regarding your visit,  I greatly appreciate you taking time to fill this out.   Annitta Needs, PhD, ANP-BC [facility]

## 2017-10-15 NOTE — Assessment & Plan Note (Addendum)
Normocytic anemia. Recheck CBC and iron studies now. Needs routine initial screening colonoscopy. Unclear if IDA. Notable history of chronic NSAIDs likely contributing to anemia.  Proceed with colonoscopy with Dr. Oneida Alar in the near future. The risks, benefits, and alternatives have been discussed in detail with the patient. They state understanding and desire to proceed.  3 month follow-up

## 2017-10-20 NOTE — Progress Notes (Signed)
Hgb 12.4, improved. Iron 122 normal. Ferritin normal at 148. Keep plans for procedures.

## 2017-10-21 NOTE — Progress Notes (Signed)
Pt is aware.  

## 2017-10-21 NOTE — Progress Notes (Signed)
LMOM to call.

## 2017-11-13 ENCOUNTER — Ambulatory Visit: Payer: BC Managed Care – PPO | Admitting: General Surgery

## 2017-11-13 ENCOUNTER — Encounter (INDEPENDENT_AMBULATORY_CARE_PROVIDER_SITE_OTHER): Payer: Self-pay

## 2017-11-13 ENCOUNTER — Encounter: Payer: Self-pay | Admitting: General Surgery

## 2017-11-13 VITALS — BP 122/79 | HR 87 | Temp 97.5°F | Resp 18 | Wt 209.3 lb

## 2017-11-13 DIAGNOSIS — L723 Sebaceous cyst: Secondary | ICD-10-CM | POA: Diagnosis not present

## 2017-11-13 NOTE — Patient Instructions (Signed)
Epidermal Cyst (Sebaceous Cyst) An epidermal cyst is sometimes called an epidermal inclusion cyst or an infundibular cyst. It is a sac made of skin tissue. The sac contains a substance called keratin. Keratin is a protein that is normally secreted through the hair follicles. When keratin becomes trapped in the top layer of skin (epidermis), it can form an epidermal cyst. Epidermal cysts are usually found on the face, neck, trunk, and genitals. These cysts are usually harmless (benign), and they may not cause symptoms unless they become infected. It is important not to pop epidermal cysts yourself. What are the causes? This condition may be caused by:  A blocked hair follicle.  A hair that curls and re-enters the skin instead of growing straight out of the skin (ingrown hair).  A blocked pore.  Irritated skin.  An injury to the skin.  Certain conditions that are passed along from parent to child (inherited).  Human papillomavirus (HPV).  What increases the risk? The following factors may make you more likely to develop an epidermal cyst:  Having acne.  Being overweight.  Wearing tight clothing.  What are the signs or symptoms? The only symptom of this condition may be a small, painless lump underneath the skin. When an epidermal cyst becomes infected, symptoms may include:  Redness.  Inflammation.  Tenderness.  Warmth.  Fever.  Keratin draining from the cyst. Keratin may look like a grayish-white, bad-smelling substance.  Pus draining from the cyst.  How is this diagnosed? This condition is diagnosed with a physical exam. In some cases, you may have a sample of tissue (biopsy) taken from your cyst to be examined under a microscope or tested for bacteria. You may be referred to a health care provider who specializes in skin care (dermatologist). How is this treated? In many cases, epidermal cysts go away on their own without treatment. If a cyst becomes infected,  treatment may include:  Opening and draining the cyst. After draining, minor surgery to remove the rest of the cyst may be done.  Antibiotic medicine to help prevent infection.  Injections of medicines (steroids) that help to reduce inflammation.  Surgery to remove the cyst. Surgery may be done if: ? The cyst becomes large. ? The cyst bothers you. ? There is a chance that the cyst could turn into cancer.  Follow these instructions at home:  Take over-the-counter and prescription medicines only as told by your health care provider.  If you were prescribed an antibiotic, use it as told by your health care provider. Do not stop using the antibiotic even if you start to feel better.  Keep the area around your cyst clean and dry.  Wear loose, dry clothing.  Do not try to pop your cyst.  Avoid touching your cyst.  Check your cyst every day for signs of infection.  Keep all follow-up visits as told by your health care provider. This is important. How is this prevented?  Wear clean, dry, clothing.  Avoid wearing tight clothing.  Keep your skin clean and dry. Shower or take baths every day.  Wash your body with a benzoyl peroxide wash when you shower or bathe. Contact a health care provider if:  Your cyst develops symptoms of infection.  Your condition is not improving or is getting worse.  You develop a cyst that looks different from other cysts you have had.  You have a fever. Get help right away if:  Redness spreads from the cyst into the surrounding area. This  information is not intended to replace advice given to you by your health care provider. Make sure you discuss any questions you have with your health care provider. Document Released: 12/09/2003 Document Revised: 09/06/2015 Document Reviewed: 11/09/2014 Elsevier Interactive Patient Education  2018 [employer].  Lipoma A lipoma is a noncancerous (benign) tumor that is made up of fat cells. This is a very  common type of soft-tissue growth. Lipomas are usually found under the skin (subcutaneous). They may occur in any tissue of the body that contains fat. Common areas for lipomas to appear include the back, shoulders, buttocks, and thighs. Lipomas grow slowly, and they are usually painless. Most lipomas do not cause problems and do not require treatment. What are the causes? The cause of this condition is not known. What increases the risk? This condition is more likely to develop in:  People who are 16-31 years old.  People who have a family history of lipomas.  What are the signs or symptoms? A lipoma usually appears as a small, round bump under the skin. It may feel soft or rubbery, but the firmness can vary. Most lipomas are not painful. However, a lipoma may become painful if it is located in an area where it pushes on nerves. How is this diagnosed? A lipoma can usually be diagnosed with a physical exam. You may also have tests to confirm the diagnosis and to rule out other conditions. Tests may include:  Imaging tests, such as a CT scan or MRI.  Removal of a tissue sample to be looked at under a microscope (biopsy).  How is this treated? Treatment is not needed for small lipomas that are not causing problems. If a lipoma continues to get bigger or it causes problems, removal is often the best option. Lipomas can also be removed to improve appearance. Removal of a lipoma is usually done with a surgery in which the fatty cells and the surrounding capsule are removed. Most often, a medicine that numbs the area (local anesthetic) is used for this procedure. Follow these instructions at home:  Keep all follow-up visits as directed by your health care provider. This is important. Contact a health care provider if:  Your lipoma becomes larger or hard.  Your lipoma becomes painful, red, or increasingly swollen. These could be signs of infection or a more serious condition. This information  is not intended to replace advice given to you by your health care provider. Make sure you discuss any questions you have with your health care provider. Document Released: 12/28/2001 Document Revised: 06/15/2015 Document Reviewed: 01/03/2014 Elsevier Interactive Patient Education  [employer].

## 2017-11-13 NOTE — Progress Notes (Signed)
[facility] History and Physical  Reason for Referral: Mass on right posterior thigh  Referring Physician: Dr. Viann Fish [name] is a 52 y.o. female.  HPI: Sara Washington is a very nice female with are lump on her posterior right thigh that has been there for years and she feels has been enlarging over the recent months. She says that the area has a "nipple like protrusion" but has never drained, been red or infected. She says that the area causes her some discomfort when she pushes on it, but otherwise she is mostly concerned because of its increasing size.  She works at a [employer] and is on her feet the majority of the day.  She is active and has no other health issues.  Past Medical History:  Diagnosis Date  . Carpal tunnel syndrome   . Migraine     Past Surgical History:  Procedure Laterality Date  . ABDOMINAL HYSTERECTOMY     due to heavy menses; no cancer; ovaries and uterus removed.   . CESAREAN SECTION      Family History  Problem Relation Age of Onset  . Diabetes Mother   . Hypertension Brother   . Hypertension Sister   . Colon cancer Neg Hx   . Colon polyps Neg Hx     Social History   Tobacco Use  . Smoking status: Never Smoker  . Smokeless tobacco: Never Used  Substance Use Topics  . Alcohol use: No  . Drug use: No    Medications: I have reviewed the patient's current medications. Allergies as of 11/13/2017   No Known Allergies     Medication List        Accurate as of 11/13/17  2:24 PM. Always use your most recent med list.          ibuprofen 600 MG tablet Commonly known as:  ADVIL,MOTRIN Take 1 tablet (600 mg total) by mouth 3 (three) times daily with meals as needed.   naproxen sodium 220 MG tablet Commonly known as:  ALEVE Take 660 mg by mouth as needed.   pantoprazole 40 MG tablet Commonly known as:  PROTONIX Take 1 tablet (40 mg total) by mouth daily. Take 30 minutes before breakfast        ROS:    A comprehensive review of systems was negative except for: Gastrointestinal: positive for reflux symptoms Neurological: positive for migraines mass on right posterior thigh  Blood pressure 122/79, pulse 87, temperature (!) 97.5 F (36.4 C), temperature source Temporal, resp. rate 18, weight 209 lb 4.8 oz (94.9 kg). Physical Exam  Constitutional: She is oriented to person, place, and time. She appears well-developed and well-nourished.  HENT:  Head: Normocephalic and atraumatic.  Eyes: Pupils are equal, round, and reactive to light. EOM are normal.  Neck: Normal range of motion.  Cardiovascular: Normal rate and regular rhythm.  Pulmonary/Chest: Effort normal and breath sounds normal.  Abdominal: Soft. She exhibits no distension. There is no tenderness.  Musculoskeletal: Normal range of motion. She exhibits no edema.  Right posterior thigh, just below gluteus fold, 2cm mobile mass, soft, small area of protruding thinned skin, no pit or drainage noted, tender with palpation  Neurological: She is alert and oriented to person, place, and time.  Skin: Skin is warm and dry.  Psychiatric: She has a normal mood and affect. Her behavior is normal. Judgment and thought content normal.  Vitals reviewed.   Results: None   Assessment & Plan:  Sara Washington is a 52 y.o. female with what is likely a lipoma versus a cyst in the right posterior thigh. The nipple like protrusion appears to be more skin related, and potentially is from rubbing at the gluteal fold.  Given that the area is getting bigger and causing the patient some discomfort and worry, it should be removed.   - Excision of the mass / cyst in the minor procedure room - Patient wants to do this procedure with local and does not think she will need any further sedation   All questions were answered to the satisfaction of the patient.  The risk and benefits of excision of the cyst were discussed including but not limited to bleeding,  infection, risk of recurrence, risk of it being something other than a lipoma/ cyst, possible need to send to pathology.  After careful consideration, Sara Washington has decided to proceed.    [name] 11/13/2017, 2:24 PM

## 2017-11-14 NOTE — H&P (Signed)
[facility] History and Physical  Reason for Referral: Mass on right posterior thigh  Referring Physician: Dr. Viann Fish Sara Washington is a 52 y.o. female.  HPI: Sara Washington is a very nice female with are lump on her posterior right thigh that has been there for years and she feels has been enlarging over the recent months. She says that the area has a "nipple like protrusion" but has never drained, been red or infected. She says that the area causes her some discomfort when she pushes on it, but otherwise she is mostly concerned because of its increasing size.  She works at a [employer] and is on her feet the majority of the day.  She is active and has no other health issues.      Past Medical History:  Diagnosis Date  . Carpal tunnel syndrome   . Migraine     Past Surgical History:  Procedure Laterality Date  . ABDOMINAL HYSTERECTOMY     due to heavy menses; no cancer; ovaries and uterus removed.   . CESAREAN SECTION           Family History  Problem Relation Age of Onset  . Diabetes Mother   . Hypertension Brother   . Hypertension Sister   . Colon cancer Neg Hx   . Colon polyps Neg Hx     Social History       Tobacco Use  . Smoking status: Never Smoker  . Smokeless tobacco: Never Used  Substance Use Topics  . Alcohol use: No  . Drug use: No    Medications: I have reviewed the patient's current medications. Allergies as of 11/13/2017   No Known Allergies              Medication List            Accurate as of 11/13/17  2:24 PM. Always use your most recent med list.           ibuprofen 600 MG tablet Commonly known as:  ADVIL,MOTRIN Take 1 tablet (600 mg total) by mouth 3 (three) times daily with meals as needed.   naproxen sodium 220 MG tablet Commonly known as:  ALEVE Take 660 mg by mouth as needed.   pantoprazole 40 MG tablet Commonly known as:  PROTONIX Take 1 tablet (40 mg total) by  mouth daily. Take 30 minutes before breakfast        ROS:  A comprehensive review of systems was negative except for: Gastrointestinal: positive for reflux symptoms Neurological: positive for migraines mass on right posterior thigh  Blood pressure 122/79, pulse 87, temperature (!) 97.5 F (36.4 C), temperature source Temporal, resp. rate 18, weight 209 lb 4.8 oz (94.9 kg). Physical Exam  Constitutional: She is oriented to person, place, and time. She appears well-developed and well-nourished.  HENT:  Head: Normocephalic and atraumatic.  Eyes: Pupils are equal, round, and reactive to light. EOM are normal.  Neck: Normal range of motion.  Cardiovascular: Normal rate and regular rhythm.  Pulmonary/Chest: Effort normal and breath sounds normal.  Abdominal: Soft. She exhibits no distension. There is no tenderness.  Musculoskeletal: Normal range of motion. She exhibits no edema.  Right posterior thigh, just below gluteus fold, 2cm mobile mass, soft, small area of protruding thinned skin, no pit or drainage noted, tender with palpation  Neurological: She is alert and oriented to person, place, and time.  Skin: Skin is warm and dry.  Psychiatric: She has a  normal mood and affect. Her behavior is normal. Judgment and thought content normal.  Vitals reviewed.   Results: None   Assessment & Plan:  Sara Washington is a 52 y.o. female with what is likely a lipoma versus a cyst in the right posterior thigh. The nipple like protrusion appears to be more skin related, and potentially is from rubbing at the gluteal fold.  Given that the area is getting bigger and causing the patient some discomfort and worry, it should be removed.   - Excision of the mass / cyst in the minor procedure room - Patient wants to do this procedure with local and does not think she will need any further sedation   All questions were answered to the satisfaction of the patient.  The risk and benefits of  excision of the cyst were discussed including but not limited to bleeding, infection, risk of recurrence, risk of it being something other than a lipoma/ cyst, possible need to send to pathology.  After careful consideration, Sara Washington has decided to proceed.    [name] 11/13/2017, 2:24 PM

## 2017-11-17 ENCOUNTER — Ambulatory Visit (HOSPITAL_COMMUNITY)
Admission: RE | Admit: 2017-11-17 | Discharge: 2017-11-17 | Disposition: A | Discharge status: Home / Self Care | Payer: BC Managed Care – PPO | Source: Ambulatory Visit | Attending: General Surgery | Admitting: General Surgery

## 2017-11-17 ENCOUNTER — Encounter (HOSPITAL_COMMUNITY): Payer: Self-pay | Admitting: *Deleted

## 2017-11-17 ENCOUNTER — Encounter (HOSPITAL_COMMUNITY)
Admission: RE | Disposition: A | Discharge status: Home / Self Care | Payer: Self-pay | Source: Ambulatory Visit | Attending: General Surgery

## 2017-11-17 DIAGNOSIS — D1723 Benign lipomatous neoplasm of skin and subcutaneous tissue of right leg: Secondary | ICD-10-CM

## 2017-11-17 DIAGNOSIS — G43909 Migraine, unspecified, not intractable, without status migrainosus: Secondary | ICD-10-CM | POA: Insufficient documentation

## 2017-11-17 HISTORY — DX: Gastro-esophageal reflux disease without esophagitis: K21.9

## 2017-11-17 HISTORY — PX: CYST EXCISION: SHX5701

## 2017-11-17 SURGERY — CYST REMOVAL
Anesthesia: LOCAL | Laterality: Right

## 2017-11-17 MED ORDER — LIDOCAINE HCL (PF) 1 % IJ SOLN
INTRAMUSCULAR | Status: DC | PRN
  Administered 2017-11-17: 8 mL

## 2017-11-17 MED ORDER — OXYCODONE HCL 5 MG PO TABS: 5.0000 mg | ORAL_TABLET | ORAL | 0 refills | Status: AC | PRN

## 2017-11-17 MED ORDER — LIDOCAINE HCL (PF) 1 % IJ SOLN
INTRAMUSCULAR | Status: AC
  Filled 2017-11-17: qty 30

## 2017-11-17 SURGICAL SUPPLY — 29 items
CLOTH BEACON ORANGE TIMEOUT ST (SAFETY) ×2 IMPLANT
COVER LIGHT HANDLE STERIS (MISCELLANEOUS) ×4 IMPLANT
DECANTER SPIKE VIAL GLASS SM (MISCELLANEOUS) ×2 IMPLANT
DERMABOND ADVANCED (GAUZE/BANDAGES/DRESSINGS)
DERMABOND ADVANCED .7 DNX12 (GAUZE/BANDAGES/DRESSINGS) IMPLANT
DRAPE EENT ADH APERT 31X51 STR (DRAPES) ×2 IMPLANT
ELECT NEEDLE TIP 2.8 STRL (NEEDLE) ×2 IMPLANT
ELECT REM PT RETURN 9FT ADLT (ELECTROSURGICAL) ×2
ELECTRODE REM PT RTRN 9FT ADLT (ELECTROSURGICAL) ×1 IMPLANT
GLOVE BIO SURGEON STRL SZ 6.5 (GLOVE) ×4 IMPLANT
GLOVE BIOGEL PI IND STRL 6.5 (GLOVE) ×1 IMPLANT
GLOVE BIOGEL PI IND STRL 7.0 (GLOVE) ×1 IMPLANT
GLOVE BIOGEL PI INDICATOR 6.5 (GLOVE) ×1
GLOVE BIOGEL PI INDICATOR 7.0 (GLOVE) ×1
GOWN STRL REUS W/TWL LRG LVL3 (GOWN DISPOSABLE) ×4 IMPLANT
KIT TURNOVER KIT A (KITS) ×2 IMPLANT
MANIFOLD NEPTUNE II (INSTRUMENTS) ×2 IMPLANT
NEEDLE HYPO 25X1 1.5 SAFETY (NEEDLE) ×2 IMPLANT
NS IRRIG 1000ML POUR BTL (IV SOLUTION) ×2 IMPLANT
PACK MINOR (CUSTOM PROCEDURE TRAY) IMPLANT
PAD ARMBOARD 7.5X6 YLW CONV (MISCELLANEOUS) ×2 IMPLANT
SET BASIN LINEN APH (SET/KITS/TRAYS/PACK) ×2 IMPLANT
SUT ETHILON 3 0 FSL (SUTURE) IMPLANT
SUT MNCRL AB 4-0 PS2 18 (SUTURE) IMPLANT
SUT PROLENE 4 0 PS 2 18 (SUTURE) IMPLANT
SUT VIC AB 3-0 SH 27 (SUTURE)
SUT VIC AB 3-0 SH 27X BRD (SUTURE) IMPLANT
SYR BULB IRRIGATION 50ML (SYRINGE) ×2 IMPLANT
SYR CONTROL 10ML LL (SYRINGE) ×2 IMPLANT

## 2017-11-17 NOTE — Interval H&P Note (Signed)
History and Physical Interval Note:  11/17/2017 10:03 AM  Sara Washington  has presented today for surgery, with the diagnosis of sebaceous cyst right leg  The various methods of treatment have been discussed with the patient and family. After consideration of risks, benefits and other options for treatment, the patient has consented to  Procedure(s) with comments: MINOR EXCISION SEBACEOUS CYST LEG (Right) - Local and tray for excision  3 0 vicryl, 4 0 monocryl, dermabond and cautery as a surgical intervention .  The patient's history has been reviewed, patient examined, no change in status, stable for surgery.  I have reviewed the patient's chart and labs.  Questions were answered to the patient's satisfaction.    No changes. Plans for excision in minor room.   [name]

## 2017-11-17 NOTE — Discharge Instructions (Signed)
Discharge Instructions: Shower per your regular routine. Take tylenol and ibuprofen as needed for pain control, alternating every 4-6 hours.  Take Roxicodone for breakthrough pain. Take colace for constipation related to narcotic pain medication. Do not pick at the dermabond glue on your incision sites.   Lipoma Removal, Care After Refer to this sheet in the next few weeks. These instructions provide you with information about caring for yourself after your procedure. Your health care provider may also give you more specific instructions. Your treatment has been planned according to current medical practices, but problems sometimes occur. Call your health care provider if you have any problems or questions after your procedure. What can I expect after the procedure? After the procedure, it is common to have:  Mild pain.  Swelling.  Bruising.  Follow these instructions at home:  Bathing  Do not take baths, swim, or use a hot tub until your health care provider approves. You may shower.  Incision care   Check your incision area every day for signs of infection. Check for: ? More redness, swelling, or pain. ? Fluid or blood. ? Warmth. ? Pus or a bad smell. Driving  Do not drive or operate heavy machinery while taking prescription pain medicine. General instructions  Take over-the-counter and prescription medicines only as told by your health care provider.  Do not use any tobacco products, such as cigarettes, chewing tobacco, and e-cigarettes. These can delay healing. If you need help quitting, ask your health care provider.  Return to your normal activities as told by your health care provider. Ask your health care provider what activities are safe for you.  Keep all follow-up visits as told by your health care provider. This is important. Contact a health care provider if:  You have more redness, swelling, or pain around your incision.  You have fluid or blood coming  from your incision.  Your incision feels warm to the touch.  You have pus or a bad smell coming from your incision.  You have pain that does not get better with medicine. Get help right away if:  You have chills or a fever.  You have severe pain.

## 2017-11-17 NOTE — Op Note (Signed)
[facility] Operative Note  11/17/17  Preoperative Diagnosis:  Right posterior thigh mass/cyst    Postoperative Diagnosis: Same   Procedure(s) Performed: Excision of mass (Lipoma)    Surgeon: Lanell Matar. Constance Haw, MD   Assistants: No qualified resident was available    Anesthesia: 1% Lidocaine    Specimens: Lipoma    Estimated Blood Loss: Minimal   Blood Replacement: None    Complications: None   Wound Class: Clean    Operative Indications: Ms. Mckellar is a 52 yo with a history of a mass on her right posterior thigh for years. It has been starting to grow and bother her, and she wanted to get this removed. She also noted a papilla like protrusion of the skin over the area. We discussed that this could be a cyst versus a lipoma, and opted to remove it given its growth and worsening discomfort.   Findings: 4cm Lipomatous lesion    Procedure: The patient was taken to the procedure room and was positioned in a left lateral/ semi prone position to expose the right thigh. The right posterior thigh was prepared and draped in the usual sterile fashion.   Lidocaine 1% was used to anesthetize the area.  An elliptical incision was made over a papilla like area and this was carried down through the subcutaneous tissue.  Hemostats were used to encircle a lipomatous lesion and sharp dissection was used to remove it from the underlying tissue.  A 4 cm lipomatous lesion was removed.  Pressure was held to control bleeding, and 3-0 Vicryl interrupted sutures were placed deep and a running 4-0 Monocryl subcuticular was performed. The skin was closed with dermabond.   Final inspection revealed acceptable hemostasis. All counts were correct at the end of the case. The patient was tolerated the procedure without complication.  The patient went to the PACU in stable condition. The specimen was sent to pathology.   Curlene Labrum, MD [facility] [address]  [place], [place] [address] [phone] (office)

## 2017-11-18 ENCOUNTER — Encounter (HOSPITAL_COMMUNITY): Payer: Self-pay | Admitting: General Surgery

## 2017-11-20 ENCOUNTER — Telehealth: Payer: Self-pay | Admitting: General Surgery

## 2017-11-20 NOTE — Telephone Encounter (Signed)
Mass consistent with lipoma. Notified patient.  Curlene Labrum, MD [facility] [address] [place], [place] [address] [phone] (office)

## 2017-12-04 ENCOUNTER — Ambulatory Visit: Payer: BC Managed Care – PPO | Admitting: General Surgery

## 2017-12-08 ENCOUNTER — Encounter (HOSPITAL_COMMUNITY)
Admission: RE | Disposition: A | Discharge status: Home / Self Care | Payer: Self-pay | Source: Ambulatory Visit | Attending: Gastroenterology

## 2017-12-08 ENCOUNTER — Ambulatory Visit (HOSPITAL_COMMUNITY)
Admission: RE | Admit: 2017-12-08 | Discharge: 2017-12-08 | Disposition: A | Discharge status: Home / Self Care | Payer: BC Managed Care – PPO | Source: Ambulatory Visit | Attending: Gastroenterology | Admitting: Gastroenterology

## 2017-12-08 ENCOUNTER — Encounter (HOSPITAL_COMMUNITY): Payer: Self-pay

## 2017-12-08 ENCOUNTER — Other Ambulatory Visit: Payer: Self-pay

## 2017-12-08 DIAGNOSIS — G43909 Migraine, unspecified, not intractable, without status migrainosus: Secondary | ICD-10-CM | POA: Diagnosis not present

## 2017-12-08 DIAGNOSIS — K2960 Other gastritis without bleeding: Secondary | ICD-10-CM | POA: Diagnosis not present

## 2017-12-08 DIAGNOSIS — K2970 Gastritis, unspecified, without bleeding: Secondary | ICD-10-CM

## 2017-12-08 DIAGNOSIS — D649 Anemia, unspecified: Secondary | ICD-10-CM

## 2017-12-08 DIAGNOSIS — K449 Diaphragmatic hernia without obstruction or gangrene: Secondary | ICD-10-CM | POA: Insufficient documentation

## 2017-12-08 DIAGNOSIS — R1013 Epigastric pain: Secondary | ICD-10-CM | POA: Diagnosis not present

## 2017-12-08 DIAGNOSIS — Z1211 Encounter for screening for malignant neoplasm of colon: Secondary | ICD-10-CM

## 2017-12-08 DIAGNOSIS — Z791 Long term (current) use of non-steroidal anti-inflammatories (NSAID): Secondary | ICD-10-CM | POA: Insufficient documentation

## 2017-12-08 DIAGNOSIS — K219 Gastro-esophageal reflux disease without esophagitis: Secondary | ICD-10-CM | POA: Insufficient documentation

## 2017-12-08 DIAGNOSIS — Q438 Other specified congenital malformations of intestine: Secondary | ICD-10-CM | POA: Insufficient documentation

## 2017-12-08 DIAGNOSIS — K648 Other hemorrhoids: Secondary | ICD-10-CM

## 2017-12-08 DIAGNOSIS — K222 Esophageal obstruction: Secondary | ICD-10-CM | POA: Diagnosis not present

## 2017-12-08 HISTORY — PX: COLONOSCOPY: SHX5424

## 2017-12-08 HISTORY — PX: ESOPHAGOGASTRODUODENOSCOPY: SHX5428

## 2017-12-08 HISTORY — PX: BIOPSY: SHX5522

## 2017-12-08 SURGERY — COLONOSCOPY
Anesthesia: Moderate Sedation

## 2017-12-08 MED ORDER — LIDOCAINE VISCOUS HCL 2 % MT SOLN
OROMUCOSAL | Status: AC
  Filled 2017-12-08: qty 15

## 2017-12-08 MED ORDER — MEPERIDINE HCL 100 MG/ML IJ SOLN
INTRAMUSCULAR | Status: AC
  Filled 2017-12-08: qty 2

## 2017-12-08 MED ORDER — SODIUM CHLORIDE 0.9 % IV SOLN
INTRAVENOUS | Status: DC
  Administered 2017-12-08: 14:00:00 via INTRAVENOUS

## 2017-12-08 MED ORDER — MEPERIDINE HCL 100 MG/ML IJ SOLN
INTRAMUSCULAR | Status: DC | PRN
  Administered 2017-12-08 (×4): 25 mg

## 2017-12-08 MED ORDER — STERILE WATER FOR IRRIGATION IR SOLN
Status: DC | PRN
  Administered 2017-12-08: 1.5 mL

## 2017-12-08 MED ORDER — LIDOCAINE VISCOUS HCL 2 % MT SOLN
OROMUCOSAL | Status: DC | PRN
  Administered 2017-12-08: 1 via OROMUCOSAL

## 2017-12-08 MED ORDER — MIDAZOLAM HCL 5 MG/5ML IJ SOLN
INTRAMUSCULAR | Status: DC | PRN
  Administered 2017-12-08: 2 mg via INTRAVENOUS
  Administered 2017-12-08: 1 mg via INTRAVENOUS
  Administered 2017-12-08 (×3): 2 mg via INTRAVENOUS
  Administered 2017-12-08: 1 mg via INTRAVENOUS

## 2017-12-08 MED ORDER — MIDAZOLAM HCL 5 MG/5ML IJ SOLN
INTRAMUSCULAR | Status: AC
  Filled 2017-12-08: qty 10

## 2017-12-08 NOTE — Op Note (Signed)
[facility] Patient Name: Sara Washington Procedure Date: 12/08/2017 2:44 PM MRN: [id number] Date of Birth: 1965-11-15 Attending MD: Barney Drain MD, MD CSN: [id number] Age: 52 Admit Type: Outpatient Procedure:                Upper GI endoscopy WITH COLD FORCEPS BIOPSY Indications:              Dyspepsia Providers:                Barney Drain MD, MD, Lurline Del, RN, Aram Candela Referring MD:             Caren Macadam Medicines:                Midazolam 2 mg IV Complications:            No immediate complications. Estimated Blood Loss:     Estimated blood loss was minimal. Procedure:                Pre-Anesthesia Assessment:                           - Prior to the procedure, a History and Physical                            was performed, and patient medications and                            allergies were reviewed. The patient's tolerance of                            previous anesthesia was also reviewed. The risks                            and benefits of the procedure and the sedation                            options and risks were discussed with the patient.                            All questions were answered, and informed consent                            was obtained. Prior Anticoagulants: The patient has                            taken no previous anticoagulant or antiplatelet                            agents. ASA Grade Assessment: I - A normal, healthy                            patient. After reviewing the risks and benefits,                            the patient was deemed in satisfactory condition to  undergo the procedure. After obtaining informed                            consent, the endoscope was passed under direct                            vision. Throughout the procedure, the patient's                            blood pressure, pulse, and oxygen saturations were                            monitored continuously. The GIF-H190  ([id number])                            scope was introduced through the mouth, and                            advanced to the second part of duodenum. The upper                            GI endoscopy was somewhat difficult due to the                            patient's agitation. Successful completion of the                            procedure was aided by increasing the dose of                            sedation medication. The patient tolerated the                            procedure fairly well. Scope In: 2:48:08 PM Scope Out: 2:53:34 PM Total Procedure Duration: 0 hours 5 minutes 26 seconds  Findings:      One benign-appearing, intrinsic moderate (circumferential scarring or       stenosis; an endoscope may pass) stenosis was found. This stenosis       measured 1.4 cm (inner diameter).      Patchy mild inflammation characterized by congestion (edema), erosions       and erythema was found in the gastric antrum. Biopsies were taken with a       cold forceps for Helicobacter pylori testing.      The examined duodenum was normal. Impression:               - Benign-appearing esophageal PEPTIC STRICTURE                           - MILD EROSIVE Gastritis. Biopsied. Moderate Sedation:      Moderate (conscious) sedation was administered by the endoscopy nurse       and supervised by the endoscopist. The following parameters were       monitored: oxygen saturation, heart rate, blood pressure, and response       to care. Total physician intraservice time was 36 minutes.  Recommendation:           - Patient has a contact number available for                            emergencies. The signs and symptoms of potential                            delayed complications were discussed with the                            patient. Return to normal activities tomorrow.                            Written discharge instructions were provided to the                            patient.                            - High fiber diet and low fat diet.                           - Continue present medications.                           - Await pathology results.                           - Return to my office in 4 months. Procedure Code(s):        --- Professional ---                           [id number], Esophagogastroduodenoscopy, flexible,                            transoral; with biopsy, single or multiple                           99153, Moderate sedation; each additional 15                            minutes intraservice time                           G0500, Moderate sedation services provided by the                            same physician or other qualified health care                            professional performing a gastrointestinal                            endoscopic service that sedation supports,  requiring the presence of an independent trained                            observer to assist in the monitoring of the                            patient's level of consciousness and physiological                            status; initial 15 minutes of intra-service time;                            patient age 45 years or older (additional time may                            be reported with [id number], as appropriate) Diagnosis Code(s):        --- Professional ---                           K22.2, Esophageal obstruction                           K29.70, Gastritis, unspecified, without bleeding                           R10.13, Epigastric pain CPT copyright 2018 American Medical Association. All rights reserved. The codes documented in this report are preliminary and upon coder review may  be revised to meet current compliance requirements. Barney Drain, MD Barney Drain MD, MD 12/08/2017 3:13:49 PM This report has been signed electronically. Number of Addenda: 0

## 2017-12-08 NOTE — H&P (Signed)
Primary Care Physician:  Caren Macadam, MD Primary Gastroenterologist:  Dr. Oneida Alar  Pre-Procedure History & Physical: HPI:  Sara Washington is a 52 y.o. female here for dyspepsia & screening.  Past Medical History:  Diagnosis Date  . Carpal tunnel syndrome   . GERD (gastroesophageal reflux disease)   . Migraine     Past Surgical History:  Procedure Laterality Date  . ABDOMINAL HYSTERECTOMY     due to heavy menses; no cancer; ovaries and uterus removed.   . CESAREAN SECTION    . CYST EXCISION Right 11/17/2017   Procedure: MINOR EXCISION SEBACEOUS CYST LEG;  Surgeon: [name], MD;  Location: [facility];  Service: General;  Laterality: Right;  Local and tray for excision  3 0 vicryl, 4 0 monocryl, dermabond and cautery    Prior to Admission medications   Medication Sig Start Date End Date Taking? Authorizing Provider  ibuprofen (ADVIL,MOTRIN) 600 MG tablet Take 1 tablet (600 mg total) by mouth 3 (three) times daily with meals as needed. 03/13/17  Yes Julianne Rice, MD  naproxen sodium (ALEVE) 220 MG tablet Take 660 mg by mouth as needed.   Yes [provider]  oxyCODONE (ROXICODONE) 5 MG immediate release tablet Take 1 tablet (5 mg total) by mouth every 4 (four) hours as needed for severe pain or breakthrough pain. 11/17/17 11/17/18 Yes [name], MD  pantoprazole (PROTONIX) 40 MG tablet Take 1 tablet (40 mg total) by mouth daily. Take 30 minutes before breakfast 10/15/17  Yes Annitta Needs, NP    Allergies as of 10/15/2017  . (No Known Allergies)    Family History  Problem Relation Age of Onset  . Diabetes Mother   . Hypertension Brother   . Hypertension Sister   . Colon cancer Neg Hx   . Colon polyps Neg Hx     Social History   Socioeconomic History  . Marital status: Single    Spouse name: Not on file  . Number of children: Not on file  . Years of education: Not on file  . Highest education level: Not on file  Occupational History   Comment: cook, now at [employer]  . Financial resource strain: Not on file  . Food insecurity:    Worry: Not on file    Inability: Not on file  . Transportation needs:    Medical: Not on file    Non-medical: Not on file  Tobacco Use  . Smoking status: Never Smoker  . Smokeless tobacco: Never Used  Substance and Sexual Activity  . Alcohol use: No  . Drug use: No  . Sexual activity: Not Currently    Partners: Male    Birth control/protection: Surgical  Lifestyle  . Physical activity:    Days per week: 5 days    Minutes per session: 30 min  . Stress: Not at all  Relationships  . Social connections:    Talks on phone: Not on file    Gets together: Not on file    Attends religious service: Not on file    Active member of club or organization: Not on file    Attends meetings of clubs or organizations: Not on file    Relationship status: Not on file  . Intimate partner violence:    Fear of current or ex partner: Not on file    Emotionally abused: Not on file    Physically abused: Not on file    Forced sexual activity: Not  on file  Other Topics Concern  . Not on file  Social History Narrative   Grew up in [place]. Has a daughter and 2 grandchildren that live here. Single, not dating.    Has two daughters, one lives in [place].    Work at [employer] as a Training and development officer.   Wear seatbelt.   Exercise by walking.   Eats all food groups.    Review of Systems: See HPI, otherwise negative ROS   Physical Exam: BP 122/78   Pulse 75   Temp 98 F (36.7 C) (Oral)   Resp (!) 22   Ht 5\' 6"  (1.676 m)   Wt 72.6 kg   SpO2 99%   BMI 25.82 kg/m  General:   Alert,  pleasant and cooperative in NAD Head:  Normocephalic and atraumatic. Neck:  Supple; Lungs:  Clear throughout to auscultation.    Heart:  Regular rate and rhythm. Abdomen:  Soft, nontender and nondistended. Normal bowel sounds, without guarding, and without rebound.   Neurologic:  Alert  and  oriented x4;  grossly normal neurologically.  Impression/Plan:    DYSPEPSIA/screening  PLAN:  EGD/TCSTODAY DISCUSSED PROCEDURE, BENEFITS, & RISKS: < 1% chance of medication reaction, bleeding, perforation, or rupture of spleen/liver.

## 2017-12-08 NOTE — Op Note (Signed)
[facility] Patient Name: Sara Washington Procedure Date: 12/08/2017 1:50 PM MRN: [id number] Date of Birth: 1965/08/18 Attending MD: Barney Drain MD, MD CSN: [id number] Age: 52 Admit Type: Outpatient Procedure:                Colonoscopy, SCREENING Indications:              Screening for colorectal malignant neoplasm Providers:                Barney Drain MD, MD, Lurline Del, RN, Aram Candela Referring MD:             Caren Macadam Medicines:                Meperidine 100 mg IV, Midazolam 8 mg IV Complications:            No immediate complications. Estimated Blood Loss:     Estimated blood loss: none. Procedure:                Pre-Anesthesia Assessment:                           - Prior to the procedure, a History and Physical                            was performed, and patient medications and                            allergies were reviewed. The patient's tolerance of                            previous anesthesia was also reviewed. The risks                            and benefits of the procedure and the sedation                            options and risks were discussed with the patient.                            All questions were answered, and informed consent                            was obtained. Prior Anticoagulants: The patient has                            taken no previous anticoagulant or antiplatelet                            agents. ASA Grade Assessment: I - A normal, healthy                            patient. After reviewing the risks and benefits,                            the patient was deemed in satisfactory condition to  undergo the procedure. After obtaining informed                            consent, the colonoscope was passed under direct                            vision. Throughout the procedure, the patient's                            blood pressure, pulse, and oxygen saturations were   monitored continuously. The CF-[id number] ([id number])                            scope was introduced through the anus and advanced                            to the the cecum, identified by appendiceal orifice                            and ileocecal valve. The colonoscopy was                            technically difficult and complex due to a tortuous                            colon. Successful completion of the procedure was                            aided by straightening and shortening the scope to                            obtain bowel loop reduction and COLOWRAP. The                            patient tolerated the procedure fairly well. The                            quality of the bowel preparation was excellent. The                            ileocecal valve, appendiceal orifice, and rectum                            were photographed. Scope In: 2:24:55 PM Scope Out: 2:42:24 PM Scope Withdrawal Time: 0 hours 14 minutes 42 seconds  Total Procedure Duration: 0 hours 17 minutes 29 seconds  Findings:      The recto-sigmoid colon and sigmoid colon were mildly redundant.      Internal hemorrhoids were found. The hemorrhoids were small.      The exam was otherwise without abnormality. Impression:               - Redundant LEFT colon.                           - SMALL  internal hemorrhoids.                           - The examination was otherwise normal. Moderate Sedation:      Moderate (conscious) sedation was administered by the endoscopy nurse       and supervised by the endoscopist. The following parameters were       monitored: oxygen saturation, heart rate, blood pressure, and response       to care. Total physician intraservice time was 36 minutes. Recommendation:           - Patient has a contact number available for                            emergencies. The signs and symptoms of potential                            delayed complications were discussed with the                             patient. Return to normal activities tomorrow.                            Written discharge instructions were provided to the                            patient.                           - High fiber diet.                           - Continue present medications.                           - Repeat colonoscopy in 10 years for surveillance.                            CONSIDER PHENERGAN OR ZOFRAN IN PREOP. Procedure Code(s):        --- Professional ---                           [phone], Colonoscopy, flexible; diagnostic, including                            collection of specimen(s) by brushing or washing,                            when performed (separate procedure)                           99153, Moderate sedation; each additional 15                            minutes intraservice time  G0500, Moderate sedation services provided by the                            same physician or other qualified health care                            professional performing a gastrointestinal                            endoscopic service that sedation supports,                            requiring the presence of an independent trained                            observer to assist in the monitoring of the                            patient's level of consciousness and physiological                            status; initial 15 minutes of intra-service time;                            patient age 47 years or older (additional time may                            be reported with [id number], as appropriate) Diagnosis Code(s):        --- Professional ---                           Z12.11, Encounter for screening for malignant                            neoplasm of colon                           K64.8, Other hemorrhoids                           Q43.8, Other specified congenital malformations of                            intestine CPT copyright 2018 American Medical  Association. All rights reserved. The codes documented in this report are preliminary and upon coder review may  be revised to meet current compliance requirements. Barney Drain, MD Barney Drain MD, MD 12/08/2017 3:08:26 PM This report has been signed electronically. Number of Addenda: 0

## 2017-12-11 ENCOUNTER — Encounter (HOSPITAL_COMMUNITY): Payer: Self-pay | Admitting: Gastroenterology

## 2017-12-11 ENCOUNTER — Telehealth: Payer: Self-pay | Admitting: Gastroenterology

## 2017-12-11 NOTE — Discharge Instructions (Signed)
YOU HAVE SMALL internal hemorrhoids. 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 Your nausea is most likely due to REFLUX AND gastritis MOST LIKELY DUE TO IBUPROFEN AND NAPROXEN.  I biopsied your stomach.   DRINK WATER TO KEEP YOUR URINE LIGHT YELLOW.  AVOID REFLUX TRIGGERS. SEE INFO BELOW.  FOLLOW A HIGH FIBER/LOW FAT DIET. AVOID ITEMS THAT CAUSE BLOATING. MEATS SHOULD BE BAKED, BROILED, OR BOILED. AVOID FRIED FOOD. SEE INFO BELOW.   TO TREAT REFLUX AND GASTRITIS DUE TO DAILY NSAID USE, TAKE PROTONIX,  TAKE 30 MINUTES PRIOR TO BREAKFAST.   YOUR BIOPSY RESULTS WILL BE BACK IN 5 BUSINESS DAYS.   FOLLOW UP IN 4 MOS.   Next colonoscopy in 10 years.   ENDOSCOPY Care After Read the instructions outlined below and refer to this sheet in the next week. These discharge instructions provide you with general information on caring for yourself after you leave the hospital. While your treatment has been planned according to the most current medical practices available, unavoidable complications occasionally occur. If you have any problems or questions after discharge, call DR. Dashun Borre, [phone].  ACTIVITY  You may resume your regular activity, but move at a slower pace for the next 24 hours.   Take frequent rest periods for the next 24 hours.   Walking will help get rid of the air and reduce the bloated feeling in your belly (abdomen).   No driving for 24 hours (because of the medicine (anesthesia) used during the test).   You may shower.   Do not sign any important legal documents or operate any machinery for 24 hours (because of the anesthesia used during the test).    NUTRITION  Drink plenty of fluids.   You may resume your normal diet as instructed by your doctor.   Begin with a light meal and progress to your normal diet. Heavy or fried foods are harder to digest and may make you feel sick to your stomach (nauseated).   Avoid alcoholic beverages  for 24 hours or as instructed.    MEDICATIONS  You may resume your normal medications.   WHAT YOU CAN EXPECT TODAY  Some feelings of bloating in the abdomen.   Passage of more gas than usual.   Spotting of blood in your stool or on the toilet paper  .  IF YOU HAD POLYPS REMOVED DURING THE ENDOSCOPY:  Eat a soft diet IF YOU HAVE NAUSEA, BLOATING, ABDOMINAL PAIN, OR VOMITING.    FINDING OUT THE RESULTS OF YOUR TEST Not all test results are available during your visit. DR. Oneida Alar WILL CALL YOU WITHIN 14 DAYS OF YOUR PROCEDUE WITH YOUR RESULTS. Do not assume everything is normal if you have not heard from DR. Adalberto Metzgar IN TWO WEEKS, CALL HER OFFICE AT [phone].  SEEK IMMEDIATE MEDICAL ATTENTION AND CALL THE OFFICE: [phone] IF:  You have more than a spotting of blood in your stool.   Your belly is swollen (abdominal distention).   You are nauseated or vomiting.   You have a temperature over 101F.   You have abdominal pain or discomfort that is severe or gets worse throughout the day.   Gastritis  Gastritis is an inflammation (the body's way of reacting to injury and/or infection) of the stomach. It is often caused by viral or bacterial (germ) infections. It can also be caused BY ALCOHOL, ASPIRIN, BC/GOODY POWDER'S, (IBUPROFEN) MOTRIN, OR ALEVE (NAPROXEN), chemicals (including alcohol), SPICY FOODS, and medications. This illness may  be associated with generalized malaise (feeling tired, not well), UPPER ABDOMINAL STOMACH cramps, and fever. One common bacterial cause of gastritis is an organism known as H. Pylori. This can be treated with antibiotics.    High-Fiber Diet A high-fiber diet changes your normal diet to include more whole grains, legumes, fruits, and vegetables. Changes in the diet involve replacing refined carbohydrates with unrefined foods. The calorie level of the diet is essentially unchanged. The Dietary Reference Intake (recommended amount) for adult males  is 38 grams per day. For adult females, it is 25 grams per day. Pregnant and lactating women should consume 28 grams of fiber per day. Fiber is the intact part of a plant that is not broken down during digestion. Functional fiber is fiber that has been isolated from the plant to provide a beneficial effect in the body. PURPOSE  Increase stool bulk.   Ease and regulate bowel movements.   Lower cholesterol.   REDUCE RISK OF COLON CANCER  INDICATIONS THAT YOU NEED MORE FIBER  Constipation and hemorrhoids.   Uncomplicated diverticulosis (intestine condition) and irritable bowel syndrome.   Weight management.   As a protective measure against hardening of the arteries (atherosclerosis), diabetes, and cancer.   GUIDELINES FOR INCREASING FIBER IN THE DIET  Start adding fiber to the diet slowly. A gradual increase of about 5 more grams (2 slices of whole-wheat bread, 2 servings of most fruits or vegetables, or 1 bowl of high-fiber cereal) per day is best. Too rapid an increase in fiber may result in constipation, flatulence, and bloating.   Drink enough water and fluids to keep your urine clear or pale yellow. Water, juice, or caffeine-free drinks are recommended. Not drinking enough fluid may cause constipation.   Eat a variety of high-fiber foods rather than one type of fiber.   Try to increase your intake of fiber through using high-fiber foods rather than fiber pills or supplements that contain small amounts of fiber.   The goal is to change the types of food eaten. Do not supplement your present diet with high-fiber foods, but replace foods in your present diet.   INCLUDE A VARIETY OF FIBER SOURCES  Replace refined and processed grains with whole grains, canned fruits with fresh fruits, and incorporate other fiber sources. White rice, white breads, and most bakery goods contain little or no fiber.   Brown whole-grain rice, buckwheat oats, and many fruits and vegetables are all good  sources of fiber. These include: broccoli, Brussels sprouts, cabbage, cauliflower, beets, sweet potatoes, white potatoes (skin on), carrots, tomatoes, eggplant, squash, berries, fresh fruits, and dried fruits.   Cereals appear to be the richest source of fiber. Cereal fiber is found in whole grains and bran. Bran is the fiber-rich outer coat of cereal grain, which is largely removed in refining. In whole-grain cereals, the bran remains. In breakfast cereals, the largest amount of fiber is found in those with "bran" in their names. The fiber content is sometimes indicated on the label.   You may need to include additional fruits and vegetables each day.   In baking, for 1 cup white flour, you may use the following substitutions:   1 cup whole-wheat flour minus 2 tablespoons.   1/2 cup white flour plus 1/2 cup whole-wheat flour.   Lifestyle and home remedies TO CONTROL HEARTBURN You may eliminate or reduce the frequency of heartburn by making the following lifestyle changes:   Control your weight. Being overweight is a major risk factor for heartburn  and GERD. Excess pounds put pressure on your abdomen, pushing up your stomach and causing acid to back up into your esophagus.    Eat smaller meals. 4 TO 6 MEALS A DAY. This reduces pressure on the lower esophageal sphincter, helping to prevent the valve from opening and acid from washing back into your esophagus.    Loosen your belt. Clothes that fit tightly around your waist put pressure on your abdomen and the lower esophageal sphincter.    Eliminate heartburn triggers. Everyone has specific triggers. Common triggers such as fatty or fried foods, spicy food, tomato sauce, carbonated beverages, alcohol, chocolate, mint, garlic, onion, caffeine and nicotine may make heartburn worse.    Avoid stooping or bending. Tying your shoes is OK. Bending over for longer periods to weed your garden isn't, especially soon after eating.    Don't lie down  after a meal. Wait at least three to four hours after eating before going to bed, and don't lie down right after eating.   Alternative medicine  Several home remedies exist for treating GERD, but they provide only temporary relief. They include drinking baking soda (sodium bicarbonate) added to water or drinking other fluids such as baking soda mixed with cream of tartar and water.  Although these liquids create temporary relief by neutralizing, washing away or buffering acids, eventually they aggravate the situation by adding gas and fluid to your stomach, increasing pressure and causing more acid reflux. Further, adding more sodium to your diet may increase your blood pressure and add stress to your heart, and excessive bicarbonate ingestion can alter the acid-base balance in your body.

## 2017-12-11 NOTE — Telephone Encounter (Signed)
PLEASE CALL PT. Please call pt. HER stomach Bx DOES NOT SHOW H PYLORI GASTRITIS. HER nausea is most likely due to REFLUX AND GI UP SET FROM NAPROXEN AND IBUPROFEN.     DRINK WATER TO KEEP YOUR URINE LIGHT YELLOW.  AVOID REFLUX TRIGGERS.   FOLLOW A HIGH FIBER/LOW FAT DIET. AVOID ITEMS THAT CAUSE BLOATING. MEATS SHOULD BE BAKED, BROILED, OR BOILED. AVOID FRIED FOOD.    TO TREAT REFLUX AND TO PREVENT GASTRITIS DUE TO DAILY NSAID USE, TAKE PROTONIX,  TAKE 30 MINUTES PRIOR TO BREAKFAST.   FOLLOW UP IN 4 MOS.   Next colonoscopy in 10 years.

## 2017-12-12 NOTE — Telephone Encounter (Signed)
LMOM to call.

## 2017-12-15 NOTE — Telephone Encounter (Signed)
PATIENT SCHEDULED AND ON RECALL  °

## 2017-12-15 NOTE — Telephone Encounter (Signed)
PT is aware.

## 2018-02-02 ENCOUNTER — Telehealth: Payer: Self-pay | Admitting: *Deleted

## 2018-02-02 NOTE — Telephone Encounter (Signed)
Called patient to clarify symptoms, she states that for the past week she has had left ear pain, with pain in the left side of her neck from the ear down. It is sore to the touch, swollen and hurts to turn her head. Denies fever, denies sore throat, no nasal congestion, denies cough, denies coughing up mucous. Please advise

## 2018-02-02 NOTE — Telephone Encounter (Signed)
Called patient, left a message  

## 2018-02-02 NOTE — Telephone Encounter (Signed)
This needs top be addressed by her pCP , please ensure pt aware of Dr Roma Kayser new office location and contact info

## 2018-02-02 NOTE — Telephone Encounter (Signed)
Pt called complaining of having ear ache and pain in her neck. Little congestion in her throat but nothing in her head. Wanted to know if something could be called in.

## 2018-02-09 ENCOUNTER — Ambulatory Visit: Payer: BC Managed Care – PPO | Admitting: Gastroenterology

## 2018-02-19 ENCOUNTER — Emergency Department (HOSPITAL_COMMUNITY): Payer: BC Managed Care – PPO

## 2018-02-19 ENCOUNTER — Encounter (HOSPITAL_COMMUNITY): Payer: Self-pay | Admitting: Emergency Medicine

## 2018-02-19 ENCOUNTER — Other Ambulatory Visit: Payer: Self-pay

## 2018-02-19 ENCOUNTER — Emergency Department (HOSPITAL_COMMUNITY)
Admission: EM | Admit: 2018-02-19 | Discharge: 2018-02-19 | Disposition: A | Discharge status: Home / Self Care | Payer: BC Managed Care – PPO | Attending: Emergency Medicine | Admitting: Emergency Medicine

## 2018-02-19 DIAGNOSIS — Z79899 Other long term (current) drug therapy: Secondary | ICD-10-CM | POA: Diagnosis not present

## 2018-02-19 DIAGNOSIS — Y9389 Activity, other specified: Secondary | ICD-10-CM | POA: Diagnosis not present

## 2018-02-19 DIAGNOSIS — S93401A Sprain of unspecified ligament of right ankle, initial encounter: Secondary | ICD-10-CM | POA: Diagnosis not present

## 2018-02-19 DIAGNOSIS — Y9222 Religious institution as the place of occurrence of the external cause: Secondary | ICD-10-CM | POA: Diagnosis not present

## 2018-02-19 DIAGNOSIS — X58XXXA Exposure to other specified factors, initial encounter: Secondary | ICD-10-CM | POA: Diagnosis not present

## 2018-02-19 DIAGNOSIS — Y999 Unspecified external cause status: Secondary | ICD-10-CM | POA: Insufficient documentation

## 2018-02-19 DIAGNOSIS — S99911A Unspecified injury of right ankle, initial encounter: Secondary | ICD-10-CM | POA: Diagnosis present

## 2018-02-19 NOTE — ED Provider Notes (Signed)
[facility] EMERGENCY DEPARTMENT Provider Note   CSN: [id number] Arrival date & time: 02/19/18  0701     History   Chief Complaint Chief Complaint  Patient presents with  . Foot Injury    HPI Sara Washington is a 53 y.o. female.  HPI Patient presents with right ankle and foot pain.  Began 4 days ago when she hurt her foot while walking in heeled shoes at church.  No other pain.  Has been hurting for the last couple days but somewhat mildly but states she worked and is on her feet a lot that made the pain more severe.  Worse with walking. Past Medical History:  Diagnosis Date  . Carpal tunnel syndrome   . GERD (gastroesophageal reflux disease)   . Migraine     Patient Active Problem List   Diagnosis Date Noted  . Encounter for screening colonoscopy   . Dyspepsia   . Lipoma of right lower extremity   . Anemia 10/15/2017  . Nausea 10/15/2017  . Obesity (BMI 30-39.9) 04/30/2017  . Fatigue 04/30/2017  . Hyperglycemia 04/30/2017  . Screen for STD (sexually transmitted disease) 04/30/2017  . Immunization due 04/30/2017    Past Surgical History:  Procedure Laterality Date  . ABDOMINAL HYSTERECTOMY     due to heavy menses; no cancer; ovaries and uterus removed.   Marland Kitchen BIOPSY  12/08/2017   Procedure: BIOPSY;  Surgeon: Danie Binder, MD;  Location: [facility];  Service: Endoscopy;;  gastric   . CESAREAN SECTION    . COLONOSCOPY N/A 12/08/2017   Procedure: COLONOSCOPY;  Surgeon: Danie Binder, MD;  Location: [facility];  Service: Endoscopy;  Laterality: N/A;  2:15pm  . CYST EXCISION Right 11/17/2017   Procedure: MINOR EXCISION SEBACEOUS CYST LEG;  Surgeon: [name], MD;  Location: [facility] ORS;  Service: General;  Laterality: Right;  Local and tray for excision  3 0 vicryl, 4 0 monocryl, dermabond and cautery  . ESOPHAGOGASTRODUODENOSCOPY N/A 12/08/2017   Procedure: ESOPHAGOGASTRODUODENOSCOPY (EGD);  Surgeon: Danie Binder, MD;  Location: [facility];  Service:  Endoscopy;  Laterality: N/A;     OB History    Gravida  2   Para  2   Term  2   Preterm      AB      Living  2     SAB      TAB      Ectopic      Multiple      Live Births               Home Medications    Prior to Admission medications   Medication Sig Start Date End Date Taking? Authorizing Provider  ibuprofen (ADVIL,MOTRIN) 600 MG tablet Take 1 tablet (600 mg total) by mouth 3 (three) times daily with meals as needed. 03/13/17   Julianne Rice, MD  naproxen sodium (ALEVE) 220 MG tablet Take 660 mg by mouth as needed.    [provider]  oxyCODONE (ROXICODONE) 5 MG immediate release tablet Take 1 tablet (5 mg total) by mouth every 4 (four) hours as needed for severe pain or breakthrough pain. 11/17/17 11/17/18  [name], MD  pantoprazole (PROTONIX) 40 MG tablet Take 1 tablet (40 mg total) by mouth daily. Take 30 minutes before breakfast 10/15/17   Annitta Needs, NP    Family History Family History  Problem Relation Age of Onset  . Diabetes Mother   . Hypertension Brother   .  Hypertension Sister   . Colon cancer Neg Hx   . Colon polyps Neg Hx     Social History Social History   Tobacco Use  . Smoking status: Never Smoker  . Smokeless tobacco: Never Used  Substance Use Topics  . Alcohol use: No  . Drug use: No     Allergies   Patient has no known allergies.   Review of Systems Review of Systems  Constitutional: Negative for appetite change.  Respiratory: Negative for shortness of breath.   Musculoskeletal:       Right ankle and foot pain.  Skin: Negative for wound.  Neurological: Negative for weakness and numbness.     Physical Exam Updated Vital Signs BP 125/83 (BP Location: Right Arm)   Pulse 77   Temp 97.7 F (36.5 C) (Oral)   Resp 18   Ht 5\' 6"  (1.676 m)   Wt 95.3 kg   SpO2 98%   BMI 33.89 kg/m   Physical Exam Musculoskeletal:     Comments: Patient with tenderness over right ankle.  Worse anterior  just inferior to the ankle.  Some medial and lateral tenderness.  Mild swelling.  Mild tenderness over Achilles.  No tenderness over fifth metatarsal.  Sensation intact.  Pulse intact.  Skin:    Capillary Refill: Capillary refill takes less than 2 seconds.  Neurological:     Mental Status: She is alert.      ED Treatments / Results  Labs (all labs ordered are listed, but only abnormal results are displayed) Labs Reviewed - No data to display  EKG None  Radiology Dg Ankle Complete Right  Result Date: 02/19/2018 CLINICAL DATA:  Ankle pain after possible injury on Sunday EXAM: RIGHT ANKLE - COMPLETE 3+ VIEW COMPARISON:  None. FINDINGS: Ankle joint effusion. No fracture, malalignment, or ankle joint narrowing. The foot is described separately. Mild insertional changes along the lower syndesmosis. IMPRESSION: Ankle joint effusion without acute osseous finding. Electronically Signed   By: Monte Fantasia M.D.   On: 02/19/2018 08:09   Dg Foot Complete Right  Result Date: 02/19/2018 CLINICAL DATA:  Foot pain with possible trauma history on Sunday. Initial encounter. EXAM: RIGHT FOOT COMPLETE - 3+ VIEW COMPARISON:  None. FINDINGS: Ankle described separately. No foot fracture or malalignment. Mild dorsal midfoot spurring. Mild first MTP and interphalangeal minimal spurring. IMPRESSION: No acute finding in the foot. Electronically Signed   By: Monte Fantasia M.D.   On: 02/19/2018 08:09    Procedures Procedures (including critical care time)  Medications Ordered in ED Medications - No data to display   Initial Impression / Assessment and Plan / ED Course  I have reviewed the triage vital signs and the nursing notes.  Pertinent labs & imaging results that were available during my care of the patient were reviewed by me and considered in my medical decision making (see chart for details).     Patient with ankle pain after injury.  X-ray overall reassuring.  Will give CAM Walker since  there is pain with both lateral movement and flexion and extension.  Orthopedic follow-up.  Final Clinical Impressions(s) / ED Diagnoses   Final diagnoses:  Sprain of right ankle, unspecified ligament, initial encounter    ED Discharge Orders    None       Davonna Belling, MD 02/19/18 [phone]

## 2018-02-19 NOTE — ED Triage Notes (Signed)
Twisted right ankle on Sunday, wearing heels. Painful, swollen, worse after working and being on her feet all week.

## 2018-04-08 ENCOUNTER — Encounter: Payer: Self-pay | Admitting: Gastroenterology

## 2018-04-08 ENCOUNTER — Telehealth: Payer: Self-pay | Admitting: Gastroenterology

## 2018-04-08 ENCOUNTER — Ambulatory Visit: Payer: BC Managed Care – PPO | Admitting: Gastroenterology

## 2018-04-08 NOTE — Telephone Encounter (Signed)
PATIENT WAS A NO SHOW AND LETTER SENT  °

## 2018-09-21 ENCOUNTER — Other Ambulatory Visit (HOSPITAL_COMMUNITY): Payer: Self-pay | Admitting: Physician Assistant

## 2018-09-21 DIAGNOSIS — Z1231 Encounter for screening mammogram for malignant neoplasm of breast: Secondary | ICD-10-CM

## 2018-10-05 ENCOUNTER — Other Ambulatory Visit: Payer: Self-pay

## 2018-10-05 ENCOUNTER — Encounter (HOSPITAL_COMMUNITY): Payer: Self-pay

## 2018-10-05 ENCOUNTER — Ambulatory Visit (HOSPITAL_COMMUNITY)
Admission: RE | Admit: 2018-10-05 | Discharge: 2018-10-05 | Disposition: A | Discharge status: Home / Self Care | Payer: BC Managed Care – PPO | Source: Ambulatory Visit | Attending: Physician Assistant | Admitting: Physician Assistant

## 2018-10-05 DIAGNOSIS — Z1231 Encounter for screening mammogram for malignant neoplasm of breast: Secondary | ICD-10-CM | POA: Diagnosis present

## 2018-10-08 ENCOUNTER — Other Ambulatory Visit: Payer: Self-pay | Admitting: *Deleted

## 2018-10-08 DIAGNOSIS — Z20822 Contact with and (suspected) exposure to covid-19: Secondary | ICD-10-CM

## 2018-10-10 LAB — NOVEL CORONAVIRUS, NAA: SARS-CoV-2, NAA: NOT DETECTED

## 2018-10-12 ENCOUNTER — Telehealth: Payer: Self-pay | Admitting: General Practice

## 2018-10-12 NOTE — Telephone Encounter (Signed)
Negative COVID results given. Patient results "NOT Detected." Caller expressed understanding. ° °

## 2018-11-17 ENCOUNTER — Other Ambulatory Visit: Payer: Self-pay

## 2018-11-17 DIAGNOSIS — Z20822 Contact with and (suspected) exposure to covid-19: Secondary | ICD-10-CM

## 2018-11-19 LAB — NOVEL CORONAVIRUS, NAA: SARS-CoV-2, NAA: NOT DETECTED

## 2019-02-04 ENCOUNTER — Other Ambulatory Visit: Payer: Self-pay

## 2019-02-04 ENCOUNTER — Ambulatory Visit: Payer: BC Managed Care – PPO | Attending: Internal Medicine

## 2019-02-04 DIAGNOSIS — Z20822 Contact with and (suspected) exposure to covid-19: Secondary | ICD-10-CM

## 2019-02-06 LAB — NOVEL CORONAVIRUS, NAA: SARS-CoV-2, NAA: NOT DETECTED

## 2019-03-01 ENCOUNTER — Ambulatory Visit: Payer: BC Managed Care – PPO | Attending: Internal Medicine

## 2019-03-01 ENCOUNTER — Other Ambulatory Visit: Payer: Self-pay

## 2019-03-01 DIAGNOSIS — Z20822 Contact with and (suspected) exposure to covid-19: Secondary | ICD-10-CM

## 2019-03-02 LAB — NOVEL CORONAVIRUS, NAA: SARS-CoV-2, NAA: NOT DETECTED

## 2019-03-23 ENCOUNTER — Encounter: Payer: Self-pay | Admitting: Radiology

## 2019-03-28 ENCOUNTER — Ambulatory Visit: Payer: BC Managed Care – PPO | Attending: Internal Medicine

## 2019-03-28 DIAGNOSIS — Z23 Encounter for immunization: Secondary | ICD-10-CM | POA: Insufficient documentation

## 2019-03-28 NOTE — Progress Notes (Signed)
   Covid-19 Vaccination Clinic  Name:  Sara Washington    MRN: [id number] DOB: [birth year]  03/28/2019  [name] was observed post Covid-19 immunization for 15 minutes without incident. She was provided with Vaccine Information Sheet and instruction to access the V-Safe system.   [name] was instructed to call 911 with any severe reactions post vaccine: Marland Kitchen Difficulty breathing  . Swelling of face and throat  . A fast heartbeat  . A bad rash all over body  . Dizziness and weakness   Immunizations Administered    Name Date Dose VIS Date Route   Pfizer COVID-19 Vaccine 03/28/2019 10:14 AM 0.3 mL 01/01/2019 Intramuscular   Manufacturer: [place]   Lot: [id number]   [place]: [id number]

## 2019-04-18 ENCOUNTER — Ambulatory Visit: Payer: BC Managed Care – PPO

## 2019-04-28 ENCOUNTER — Ambulatory Visit: Payer: BC Managed Care – PPO | Attending: Internal Medicine

## 2019-04-28 DIAGNOSIS — Z23 Encounter for immunization: Secondary | ICD-10-CM

## 2019-04-28 NOTE — Progress Notes (Signed)
   Covid-19 Vaccination Clinic  Name:  Nicolee Rayborn    MRN: [id number] DOB: 03-05-1965  04/28/2019  Ms. Pelchat was observed post Covid-19 immunization for 15 minutes without incident. She was provided with Vaccine Information Sheet and instruction to access the V-Safe system.   Ms. Thron was instructed to call 911 with any severe reactions post vaccine: Marland Kitchen Difficulty breathing  . Swelling of face and throat  . A fast heartbeat  . A bad rash all over body  . Dizziness and weakness   Immunizations Administered    Name Date Dose VIS Date Route   Pfizer COVID-19 Vaccine 04/28/2019  9:18 AM 0.3 mL 01/01/2019 Intramuscular   Manufacturer: [employer], [employer]   Lot: [id number]   [place]: [id number]

## 2019-06-15 ENCOUNTER — Other Ambulatory Visit: Payer: Self-pay

## 2019-06-15 ENCOUNTER — Ambulatory Visit: Payer: BC Managed Care – PPO | Attending: Internal Medicine

## 2019-06-15 DIAGNOSIS — Z20822 Contact with and (suspected) exposure to covid-19: Secondary | ICD-10-CM

## 2019-06-16 LAB — SARS-COV-2, NAA 2 DAY TAT

## 2019-06-16 LAB — NOVEL CORONAVIRUS, NAA: SARS-CoV-2, NAA: NOT DETECTED

## 2019-09-07 ENCOUNTER — Other Ambulatory Visit (HOSPITAL_COMMUNITY): Payer: Self-pay | Admitting: Anesthesiology

## 2019-11-02 ENCOUNTER — Other Ambulatory Visit (HOSPITAL_COMMUNITY): Payer: Self-pay | Admitting: Family Medicine

## 2019-11-02 DIAGNOSIS — Z1231 Encounter for screening mammogram for malignant neoplasm of breast: Secondary | ICD-10-CM

## 2019-11-11 ENCOUNTER — Ambulatory Visit (HOSPITAL_COMMUNITY)
Admission: RE | Admit: 2019-11-11 | Discharge: 2019-11-11 | Disposition: A | Discharge status: Home / Self Care | Payer: BC Managed Care – PPO | Source: Ambulatory Visit | Attending: Family Medicine | Admitting: Family Medicine

## 2019-11-11 ENCOUNTER — Other Ambulatory Visit: Payer: Self-pay

## 2019-11-11 DIAGNOSIS — Z1231 Encounter for screening mammogram for malignant neoplasm of breast: Secondary | ICD-10-CM | POA: Diagnosis not present

## 2019-11-29 ENCOUNTER — Other Ambulatory Visit: Payer: Self-pay

## 2019-11-29 ENCOUNTER — Ambulatory Visit (HOSPITAL_COMMUNITY)
Admission: RE | Admit: 2019-11-29 | Discharge: 2019-11-29 | Disposition: A | Discharge status: Home / Self Care | Payer: BC Managed Care – PPO | Source: Ambulatory Visit | Attending: Family Medicine | Admitting: Family Medicine

## 2019-11-29 ENCOUNTER — Other Ambulatory Visit (HOSPITAL_COMMUNITY): Payer: Self-pay | Admitting: Family Medicine

## 2019-11-29 DIAGNOSIS — M79601 Pain in right arm: Secondary | ICD-10-CM | POA: Insufficient documentation

## 2019-11-29 DIAGNOSIS — M25511 Pain in right shoulder: Secondary | ICD-10-CM

## 2019-12-01 ENCOUNTER — Encounter: Payer: Self-pay | Admitting: Orthopedic Surgery

## 2019-12-01 ENCOUNTER — Ambulatory Visit (INDEPENDENT_AMBULATORY_CARE_PROVIDER_SITE_OTHER): Payer: BC Managed Care – PPO | Admitting: Orthopedic Surgery

## 2019-12-01 ENCOUNTER — Other Ambulatory Visit: Payer: Self-pay

## 2019-12-01 VITALS — BP 117/80 | HR 84 | Ht 66.0 in | Wt 209.0 lb

## 2019-12-01 DIAGNOSIS — M25511 Pain in right shoulder: Secondary | ICD-10-CM

## 2019-12-01 MED ORDER — DICLOFENAC-MISOPROSTOL 50-0.2 MG PO TBEC: 1.0000 | DELAYED_RELEASE_TABLET | Freq: Two times a day (BID) | ORAL | 2 refills | Status: AC

## 2019-12-01 NOTE — Patient Instructions (Signed)
Take medications as prescribed  Encourage you to move your right arm as needed.  Ok to attempt all usual activities.   If your pain continues to be a problem, we can try a steroid injection in your shoulder.  We can also consider an MRI to further evaluate the shoulder.

## 2019-12-01 NOTE — Progress Notes (Signed)
New Patient Visit  Assessment: Sara Washington is a 54 y.o. female with the following: Right shoulder pain in the setting of recent Covid vaccination  Plan: I had extensive discussion with Sara Washington in clinic today regarding her right shoulder.  Her current discomfort started atraumatically.  On physical exam, she has excellent range of motion, and excellent strength.  As result, I am less concerned about an injury to the rotator cuff.  Nonetheless, her shoulder is clearly irritated following a recent vaccination.  To date, she has not done much to improve her symptoms.  As result, I have provided her with a prescription for Arthrotec to start taking immediately.  I have also recommended that she not limit her activities at work.  If she continues to have issues, we can consider formal physical therapy, or potentially proceeding with injection.  At this time, she is comfortable with medications and activities as tolerated.  No follow-up is needed, she will contact the clinic if she has issues.  Follow-up: Return if symptoms worsen or fail to improve.  Subjective:  Chief Complaint  Patient presents with  . Shoulder Pain    right 4-5 weeks of pain, no fall or injury.     History of Present Illness: Sara Washington is a 54 y.o. female who presents for evaluation of her right shoulder.  Specifically, she denies an injury to her right shoulder.  Approximately 1 month ago she received her Covid booster vaccination.  Since then, she has noted significant pain, and dysfunction of the right shoulder.  She has been taking over-the-counter pain medications with limited resolution.  No intra-articular steroid injections.  She has not done any physical therapy.  She works in a [employer], and her activities have been limited since the irritation began.  She is not allowed to return to full duty at this time due to her pain.   Chief Complaint  Patient presents with  . Shoulder Pain    right 4-5 weeks of  pain, no fall or injury.     Review of Systems: No fevers or chills No numbness or tingling No chest pain No shortness of breath No bowel or bladder dysfunction No GI distress No headaches   Medical History:  Past Medical History:  Diagnosis Date  . Carpal tunnel syndrome   . GERD (gastroesophageal reflux disease)   . Migraine     Past Surgical History:  Procedure Laterality Date  . ABDOMINAL HYSTERECTOMY     due to heavy menses; no cancer; ovaries and uterus removed.   Marland Kitchen BIOPSY  12/08/2017   Procedure: BIOPSY;  Surgeon: Danie Binder, MD;  Location: [facility];  Service: Endoscopy;;  gastric   . CESAREAN SECTION    . COLONOSCOPY N/A 12/08/2017   Procedure: COLONOSCOPY;  Surgeon: Danie Binder, MD;  Location: [facility];  Service: Endoscopy;  Laterality: N/A;  2:15pm  . CYST EXCISION Right 11/17/2017   Procedure: MINOR EXCISION SEBACEOUS CYST LEG;  Surgeon: [name], MD;  Location: [facility];  Service: General;  Laterality: Right;  Local and tray for excision  3 0 vicryl, 4 0 monocryl, dermabond and cautery  . ESOPHAGOGASTRODUODENOSCOPY N/A 12/08/2017   Procedure: ESOPHAGOGASTRODUODENOSCOPY (EGD);  Surgeon: Danie Binder, MD;  Location: [facility];  Service: Endoscopy;  Laterality: N/A;    Family History  Problem Relation Age of Onset  . Diabetes Mother   . Hypertension Brother   . Hypertension Sister   . Colon cancer Neg  Hx   . Colon polyps Neg Hx    Social History   Tobacco Use  . Smoking status: Never Smoker  . Smokeless tobacco: Never Used  Vaping Use  . Vaping Use: Never used  Substance Use Topics  . Alcohol use: No  . Drug use: No    No Known Allergies  Current Meds  Medication Sig  . ibuprofen (ADVIL,MOTRIN) 600 MG tablet Take 1 tablet (600 mg total) by mouth 3 (three) times daily with meals as needed.  . methocarbamol (ROBAXIN) 500 MG tablet Take 500 mg by mouth every 8 (eight) hours as needed.  . naproxen sodium (ALEVE) 220  MG tablet Take 660 mg by mouth as needed.  . pantoprazole (PROTONIX) 40 MG tablet Take 1 tablet (40 mg total) by mouth daily. Take 30 minutes before breakfast    Objective: BP 117/80   Pulse 84   Ht 5\' 6"  (1.676 m)   Wt 209 lb (94.8 kg)   BMI 33.73 kg/m   Physical Exam:  General: Alert and oriented, no acute distress Gait: Normal  Evaluation of the right shoulder demonstrates no deformity or atrophy.  She does have tenderness to palpation, primarily over the anterior aspect of her shoulder.  Range of motion is near full, in comparison to contralateral side.  However, she does have pain at extremes.  Greater than 150 degrees of forward flexion.  External rotation or side to 50 degrees.  Internal rotation to the lumbar spine.  Negative belly press.    IMAGING: I personally reviewed images previously obtained in clinic   X-rays of the right shoulder demonstrates neutral alignment.  Glenohumeral joint space is maintained.  There is no appreciable proximal humeral migration.   New Medications:  Meds ordered this encounter  Medications  . Diclofenac-miSOPROStol 50-0.2 MG TBEC    Sig: Take 1 tablet by mouth 2 (two) times daily.    Dispense:  60 tablet    Refill:  2      Mordecai Rasmussen, MD  12/01/2019 3:12 PM

## 2020-11-17 IMAGING — MG MM DIGITAL SCREENING BILAT W/ TOMO W/ CAD
8 series · 8 of 24 positions shown · non-contrast
Comparison: None.

CLINICAL DATA: Screening. This is the patient's initial baseline
mammogram.

EXAM:
DIGITAL SCREENING BILATERAL MAMMOGRAM WITH TOMO AND CAD

[R CC synth-2D]
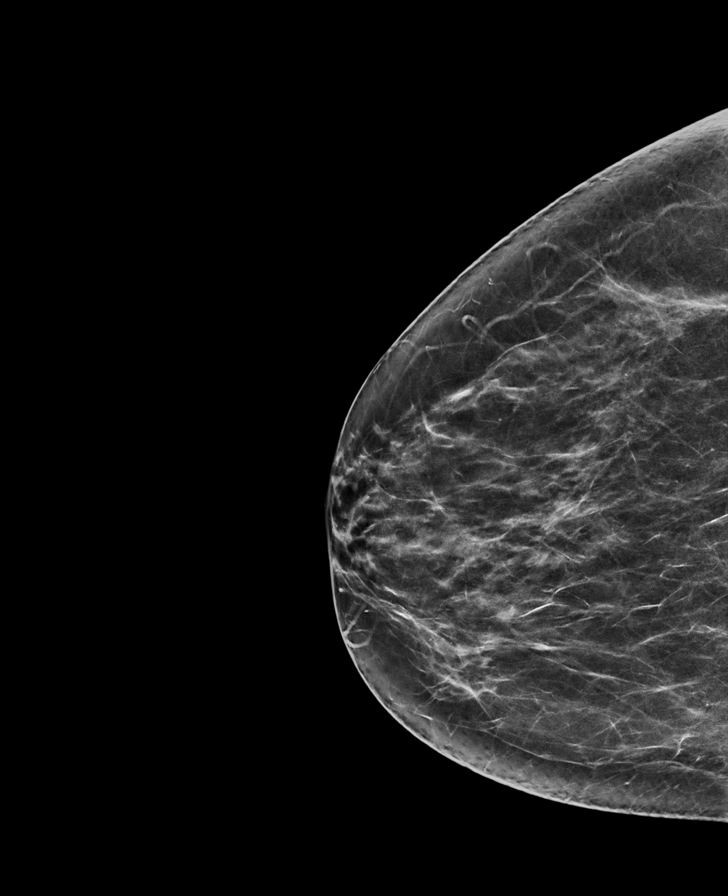

[L MLO synth-2D]
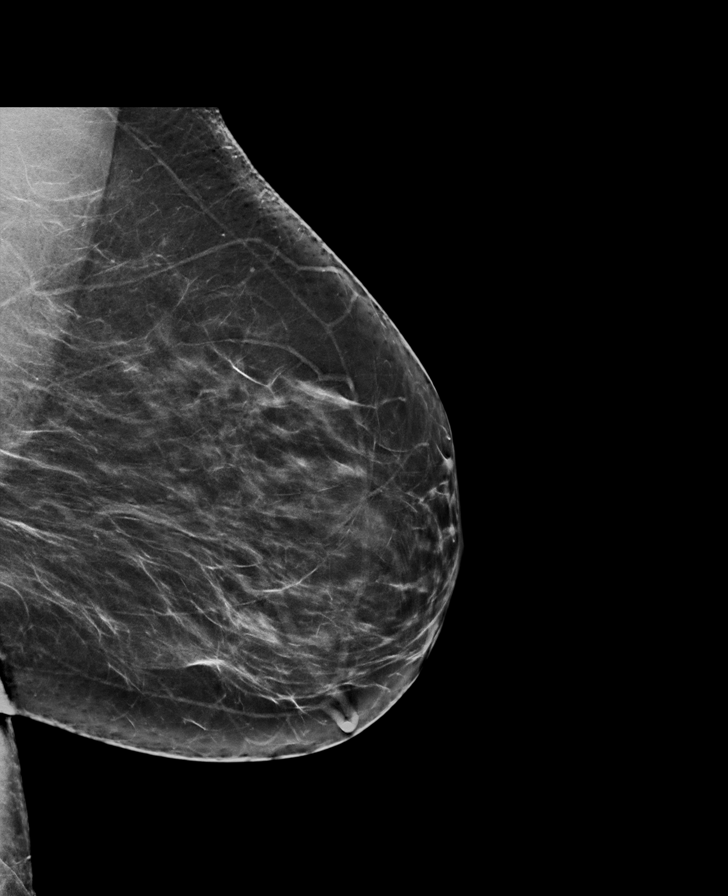

[R MLO synth-2D]
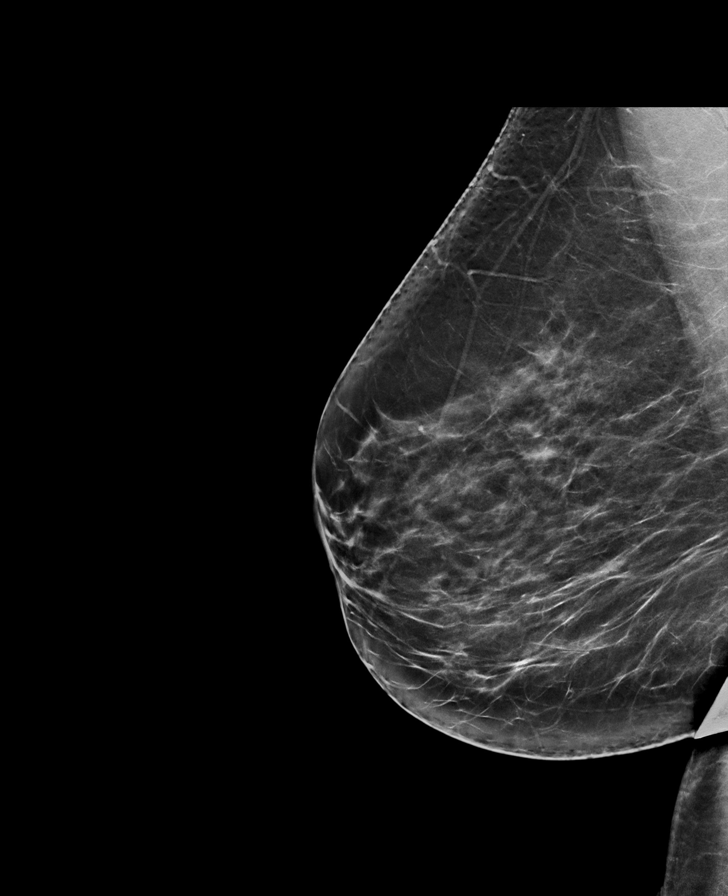

[L CC synth-2D]
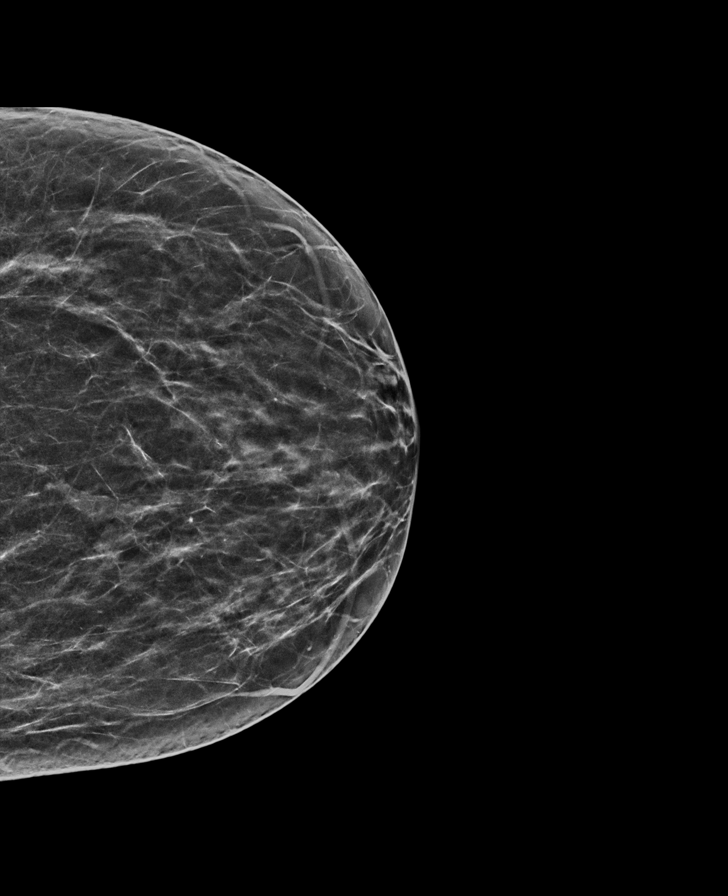

[R CC tomo · tomo slice 36/71.0]
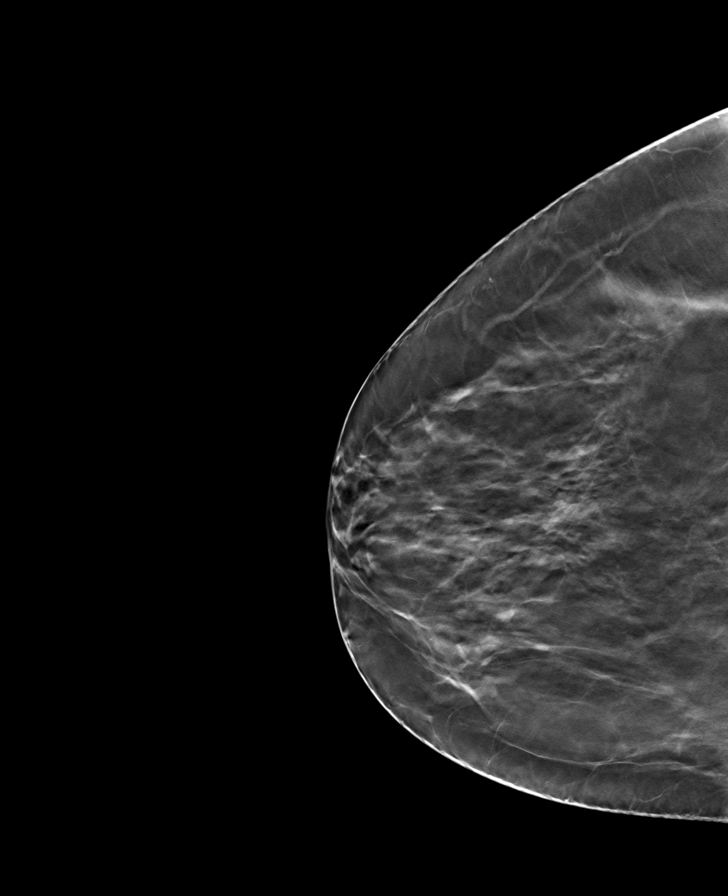

[L CC tomo · tomo slice 31/62.0]
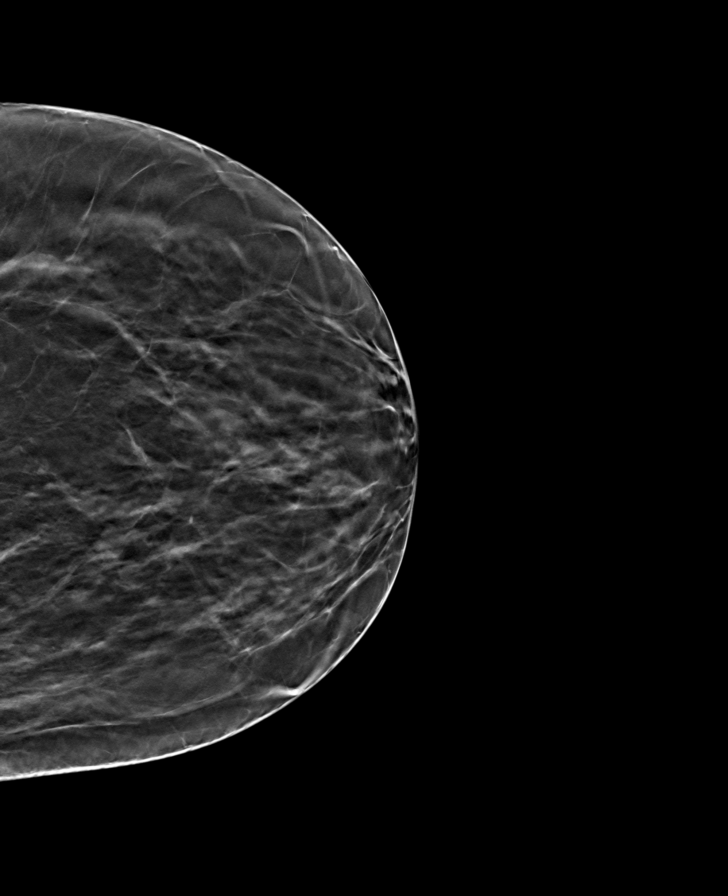

[R MLO tomo · tomo slice 39/78.0]
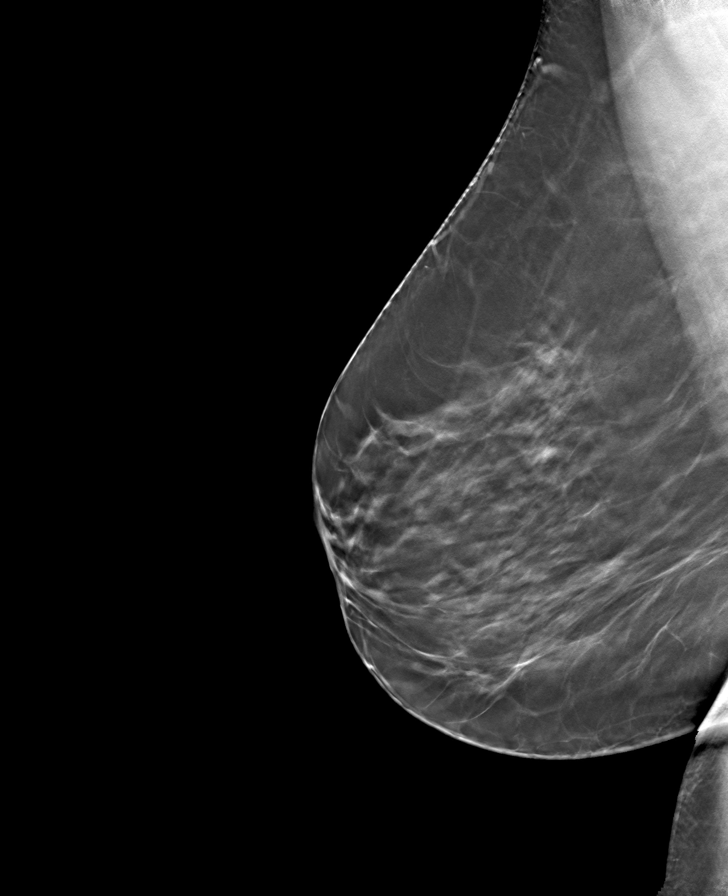

[L MLO tomo · tomo slice 39/77.0]
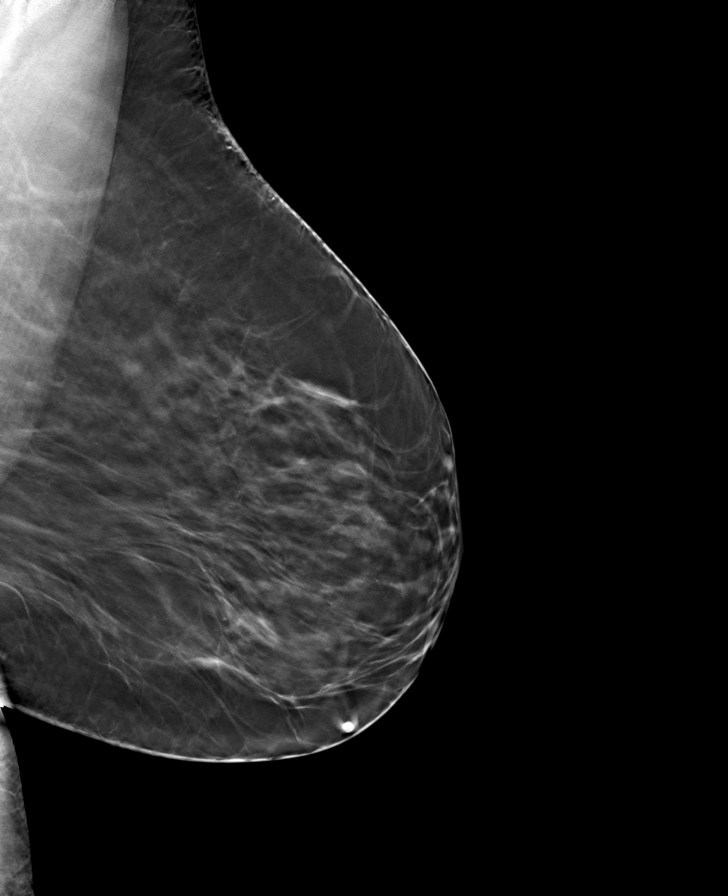

[8 of 24 positions shown; findings below may reference images not displayed]

ACR Breast Density Category b: There are scattered areas of
fibroglandular density.
FINDINGS: There are no findings suspicious for malignancy. Images were
processed with CAD.
IMPRESSION: No mammographic evidence of malignancy. A result letter of this
screening mammogram will be mailed directly to the patient.

RECOMMENDATION:
Screening mammogram in one year. (Code:[id number]-[id number])

BI-RADS CATEGORY  1: Negative.

## 2020-12-11 ENCOUNTER — Other Ambulatory Visit (HOSPITAL_COMMUNITY): Payer: Self-pay | Admitting: Family Medicine

## 2020-12-11 DIAGNOSIS — Z1231 Encounter for screening mammogram for malignant neoplasm of breast: Secondary | ICD-10-CM

## 2020-12-25 ENCOUNTER — Ambulatory Visit (HOSPITAL_COMMUNITY)
Admission: RE | Admit: 2020-12-25 | Discharge: 2020-12-25 | Disposition: A | Discharge status: Home / Self Care | Payer: BC Managed Care – PPO | Source: Ambulatory Visit | Attending: Family Medicine | Admitting: Family Medicine

## 2020-12-25 ENCOUNTER — Other Ambulatory Visit: Payer: Self-pay

## 2020-12-25 DIAGNOSIS — Z1231 Encounter for screening mammogram for malignant neoplasm of breast: Secondary | ICD-10-CM | POA: Diagnosis present

## 2021-05-25 ENCOUNTER — Other Ambulatory Visit (HOSPITAL_COMMUNITY): Payer: Self-pay | Admitting: Nurse Practitioner

## 2021-05-25 ENCOUNTER — Ambulatory Visit (HOSPITAL_COMMUNITY)
Admission: RE | Admit: 2021-05-25 | Discharge: 2021-05-25 | Disposition: A | Discharge status: Home / Self Care | Payer: BC Managed Care – PPO | Source: Ambulatory Visit | Attending: Nurse Practitioner | Admitting: Nurse Practitioner

## 2021-05-25 DIAGNOSIS — M5416 Radiculopathy, lumbar region: Secondary | ICD-10-CM | POA: Diagnosis present

## 2021-07-26 ENCOUNTER — Other Ambulatory Visit (HOSPITAL_COMMUNITY): Payer: Self-pay | Admitting: Adult Health

## 2021-07-26 ENCOUNTER — Ambulatory Visit (HOSPITAL_COMMUNITY)
Admission: RE | Admit: 2021-07-26 | Discharge: 2021-07-26 | Disposition: A | Discharge status: Home / Self Care | Payer: BC Managed Care – PPO | Source: Ambulatory Visit | Attending: Adult Health | Admitting: Adult Health

## 2021-07-26 DIAGNOSIS — M79674 Pain in right toe(s): Secondary | ICD-10-CM | POA: Diagnosis present

## 2021-07-26 DIAGNOSIS — M79652 Pain in left thigh: Secondary | ICD-10-CM

## 2021-07-26 DIAGNOSIS — M25562 Pain in left knee: Secondary | ICD-10-CM

## 2021-07-26 DIAGNOSIS — M79605 Pain in left leg: Secondary | ICD-10-CM

## 2021-10-04 ENCOUNTER — Encounter (HOSPITAL_COMMUNITY): Payer: Self-pay | Admitting: Emergency Medicine

## 2021-10-04 ENCOUNTER — Other Ambulatory Visit: Payer: Self-pay

## 2021-10-04 ENCOUNTER — Emergency Department (HOSPITAL_COMMUNITY)
Admission: EM | Admit: 2021-10-04 | Discharge: 2021-10-04 | Disposition: A | Discharge status: Home / Self Care | Payer: BC Managed Care – PPO | Attending: Emergency Medicine | Admitting: Emergency Medicine

## 2021-10-04 DIAGNOSIS — U071 COVID-19: Secondary | ICD-10-CM | POA: Insufficient documentation

## 2021-10-04 DIAGNOSIS — J029 Acute pharyngitis, unspecified: Secondary | ICD-10-CM | POA: Diagnosis present

## 2021-10-04 LAB — SARS CORONAVIRUS 2 BY RT PCR: SARS Coronavirus 2 by RT PCR: POSITIVE — AB

## 2021-10-04 LAB — GROUP A STREP BY PCR: Group A Strep by PCR: NOT DETECTED

## 2021-10-04 NOTE — Discharge Instructions (Signed)
Drink plenty of fluids.  Take Tylenol or Motrin for aches and fever.  Follow-up if not improving.  You will be considered noninfectious starting next Tuesday

## 2021-10-04 NOTE — ED Triage Notes (Signed)
Pt c/o sore throat, congestion, headache and body aches since yesterday.

## 2021-10-04 NOTE — ED Provider Notes (Signed)
  [facility] EMERGENCY DEPARTMENT Provider Note   CSN: [id number] Arrival date & time: 10/04/21  8182     History {Add pertinent medical, surgical, social history, OB history to HPI:1} Chief Complaint  Patient presents with   Sore Throat    Sara Washington is a 56 y.o. female.  Patient complains of sore throat and mild congestion   Sore Throat       Home Medications Prior to Admission medications   Medication Sig Start Date End Date Taking? Authorizing Provider  Diclofenac-miSOPROStol 50-0.2 MG TBEC Take 1 tablet by mouth 2 (two) times daily. 12/01/19   Mordecai Rasmussen, MD  ibuprofen (ADVIL,MOTRIN) 600 MG tablet Take 1 tablet (600 mg total) by mouth 3 (three) times daily with meals as needed. 03/13/17   Julianne Rice, MD  methocarbamol (ROBAXIN) 500 MG tablet Take 500 mg by mouth every 8 (eight) hours as needed. 11/23/19   [provider]  naproxen sodium (ALEVE) 220 MG tablet Take 660 mg by mouth as needed.    [provider]  pantoprazole (PROTONIX) 40 MG tablet Take 1 tablet (40 mg total) by mouth daily. Take 30 minutes before breakfast 10/15/17   Annitta Needs, NP      Allergies    Patient has no known allergies.    Review of Systems   Review of Systems  Physical Exam Updated Vital Signs BP 128/88   Pulse 93   Temp 98.4 F (36.9 C) (Oral)   Resp 20   Ht '5\' 6"'$  (1.676 m)   Wt 72.6 kg   SpO2 97%   BMI 25.82 kg/m  Physical Exam  ED Results / Procedures / Treatments   Labs (all labs ordered are listed, but only abnormal results are displayed) Labs Reviewed  SARS CORONAVIRUS 2 BY RT PCR - Abnormal; Notable for the following components:      Result Value   SARS Coronavirus 2 by RT PCR POSITIVE (*)    All other components within normal limits  GROUP A STREP BY PCR    EKG None  Radiology No results found.  Procedures Procedures  {Document cardiac monitor, telemetry assessment procedure when appropriate:1}  Medications Ordered in  ED Medications - No data to display  ED Course/ Medical Decision Making/ A&P                           Medical Decision Making  Patient with COVID-19.  She will take Tylenol fluids and follow-up as needed  {Document critical care time when appropriate:1} {Document review of labs and clinical decision tools ie heart score, Chads2Vasc2 etc:1}  {Document your independent review of radiology images, and any outside records:1} {Document your discussion with family members, caretakers, and with consultants:1} {Document social determinants of health affecting pt's care:1} {Document your decision making why or why not admission, treatments were needed:1} Final Clinical Impression(s) / ED Diagnoses Final diagnoses:  COVID    Rx / DC Orders ED Discharge Orders     None

## 2021-10-07 NOTE — ED Provider Notes (Signed)
[facility] EMERGENCY DEPARTMENT Provider Note   CSN: [id number] Arrival date & time: 10/04/21  2202     History  Chief Complaint  Patient presents with   Sore Throat    Sara Washington is a 56 y.o. female.  Patient complains of sore throat.  No past medical history  The history is provided by the patient and medical records. No language interpreter was used.  Sore Throat This is a new problem. The current episode started 2 days ago. The problem occurs constantly. The problem has not changed since onset.Pertinent negatives include no chest pain, no abdominal pain and no headaches. Nothing aggravates the symptoms. Nothing relieves the symptoms. She has tried nothing for the symptoms. The treatment provided no relief.       Home Medications Prior to Admission medications   Medication Sig Start Date End Date Taking? Authorizing Provider  Diclofenac-miSOPROStol 50-0.2 MG TBEC Take 1 tablet by mouth 2 (two) times daily. 12/01/19   Mordecai Rasmussen, MD  ibuprofen (ADVIL,MOTRIN) 600 MG tablet Take 1 tablet (600 mg total) by mouth 3 (three) times daily with meals as needed. 03/13/17   Julianne Rice, MD  methocarbamol (ROBAXIN) 500 MG tablet Take 500 mg by mouth every 8 (eight) hours as needed. 11/23/19   [provider]  naproxen sodium (ALEVE) 220 MG tablet Take 660 mg by mouth as needed.    [provider]  pantoprazole (PROTONIX) 40 MG tablet Take 1 tablet (40 mg total) by mouth daily. Take 30 minutes before breakfast 10/15/17   Annitta Needs, NP      Allergies    Patient has no known allergies.    Review of Systems   Review of Systems  Constitutional:  Negative for appetite change and fatigue.  HENT:  Negative for congestion, ear discharge and sinus pressure.        Sore throat  Eyes:  Negative for discharge.  Respiratory:  Negative for cough.   Cardiovascular:  Negative for chest pain.  Gastrointestinal:  Negative for abdominal pain and diarrhea.   Genitourinary:  Negative for frequency and hematuria.  Musculoskeletal:  Negative for back pain.  Skin:  Negative for rash.  Neurological:  Negative for seizures and headaches.  Psychiatric/Behavioral:  Negative for hallucinations.     Physical Exam Updated Vital Signs BP 128/88   Pulse 93   Temp 98.4 F (36.9 C) (Oral)   Resp 20   Ht '5\' 6"'$  (1.676 m)   Wt 72.6 kg   SpO2 97%   BMI 25.82 kg/m  Physical Exam Vitals and nursing note reviewed.  Constitutional:      Appearance: She is well-developed.  HENT:     Head: Normocephalic.     Nose: Nose normal.  Eyes:     General: No scleral icterus.    Conjunctiva/sclera: Conjunctivae normal.  Neck:     Thyroid: No thyromegaly.  Cardiovascular:     Rate and Rhythm: Normal rate and regular rhythm.     Heart sounds: No murmur heard.    No friction rub. No gallop.  Pulmonary:     Breath sounds: No stridor. No wheezing or rales.  Chest:     Chest wall: No tenderness.  Abdominal:     General: There is no distension.     Tenderness: There is no abdominal tenderness. There is no rebound.  Musculoskeletal:        General: Normal range of motion.     Cervical back: Neck supple.  Lymphadenopathy:  Cervical: No cervical adenopathy.  Skin:    Findings: No erythema or rash.  Neurological:     Mental Status: She is alert and oriented to person, place, and time.     Motor: No abnormal muscle tone.     Coordination: Coordination normal.  Psychiatric:        Behavior: Behavior normal.     ED Results / Procedures / Treatments   Labs (all labs ordered are listed, but only abnormal results are displayed) Labs Reviewed  SARS CORONAVIRUS 2 BY RT PCR - Abnormal; Notable for the following components:      Result Value   SARS Coronavirus 2 by RT PCR POSITIVE (*)    All other components within normal limits  GROUP A STREP BY PCR    EKG None  Radiology No results found.  Procedures Procedures    Medications Ordered in  ED Medications - No data to display  ED Course/ Medical Decision Making/ A&P                           Medical Decision Making This patient presents to the ED for concern of sore throat, this involves an extensive number of treatment options, and is a complaint that carries with it a high risk of complications and morbidity.  The differential diagnosis includes viral pharyngitis, COVID   Co morbidities that complicate the patient evaluation  None   Additional history obtained:  Additional history obtained from patient External records from outside source obtained and reviewed including hospital records   Lab Tests:  I Ordered, and personally interpreted labs.  The pertinent results include: COVID test positive   Imaging Studies ordered: No imaging Cardiac Monitoring: / EKG:  The patient was maintained on a cardiac monitor.  I personally viewed and interpreted the cardiac monitored which showed an underlying rhythm of: Normal sinus rhythm   Consultations Obtained:  No consultant Problem List / ED Course / Critical interventions / Medication management  COVID No medicines Reevaluation of the patient after these medicines showed that the patient stayed the same I have reviewed the patients home medicines and have made adjustments as needed   Social Determinants of Health:  None   Test / Admission - Considered:  No need for admission  Patient with COVID.  He will drink plenty of fluids and take Tylenol Motrin and follow-up as needed        Final Clinical Impression(s) / ED Diagnoses Final diagnoses:  COVID    Rx / DC Orders ED Discharge Orders     None         Milton Ferguson, MD 10/07/21 1009

## 2021-11-23 ENCOUNTER — Other Ambulatory Visit (HOSPITAL_COMMUNITY): Payer: Self-pay | Admitting: Family Medicine

## 2021-11-23 DIAGNOSIS — Z1231 Encounter for screening mammogram for malignant neoplasm of breast: Secondary | ICD-10-CM

## 2021-12-28 ENCOUNTER — Ambulatory Visit (HOSPITAL_COMMUNITY)
Admission: RE | Admit: 2021-12-28 | Discharge: 2021-12-28 | Disposition: A | Discharge status: Home / Self Care | Payer: BC Managed Care – PPO | Source: Ambulatory Visit | Attending: Family Medicine | Admitting: Family Medicine

## 2021-12-28 DIAGNOSIS — Z1231 Encounter for screening mammogram for malignant neoplasm of breast: Secondary | ICD-10-CM

## 2022-03-21 ENCOUNTER — Encounter: Payer: Self-pay | Admitting: Radiology

## 2022-05-22 DIAGNOSIS — G43009 Migraine without aura, not intractable, without status migrainosus: Secondary | ICD-10-CM | POA: Insufficient documentation

## 2022-05-22 DIAGNOSIS — G5603 Carpal tunnel syndrome, bilateral upper limbs: Secondary | ICD-10-CM | POA: Insufficient documentation

## 2022-05-24 ENCOUNTER — Ambulatory Visit: Payer: BC Managed Care – PPO | Admitting: Orthopedic Surgery

## 2022-11-29 ENCOUNTER — Other Ambulatory Visit (HOSPITAL_COMMUNITY): Payer: Self-pay | Admitting: Family Medicine

## 2022-11-29 DIAGNOSIS — Z1231 Encounter for screening mammogram for malignant neoplasm of breast: Secondary | ICD-10-CM

## 2023-01-01 ENCOUNTER — Ambulatory Visit (HOSPITAL_COMMUNITY): Payer: BC Managed Care – PPO

## 2023-01-08 ENCOUNTER — Ambulatory Visit (HOSPITAL_COMMUNITY)
Admission: RE | Admit: 2023-01-08 | Discharge: 2023-01-08 | Disposition: A | Discharge status: Home / Self Care | Payer: BC Managed Care – PPO | Source: Ambulatory Visit | Attending: Family Medicine | Admitting: Family Medicine

## 2023-01-08 DIAGNOSIS — Z1231 Encounter for screening mammogram for malignant neoplasm of breast: Secondary | ICD-10-CM | POA: Diagnosis present

## 2023-01-20 ENCOUNTER — Other Ambulatory Visit: Payer: Self-pay | Admitting: Podiatry

## 2023-02-10 ENCOUNTER — Other Ambulatory Visit (HOSPITAL_COMMUNITY): Payer: BC Managed Care – PPO

## 2023-02-12 ENCOUNTER — Ambulatory Visit (HOSPITAL_COMMUNITY): Admit: 2023-02-12 | Payer: BC Managed Care – PPO

## 2023-02-12 SURGERY — TOE ARTHROPLASTY
Anesthesia: Monitor Anesthesia Care | Site: Toe | Laterality: Right

## 2023-07-08 IMAGING — DX DG LUMBAR SPINE 2-3V
3 series · 3 of 3 positions shown · non-contrast
Comparison: None Available.

CLINICAL DATA: Lumbar radiculopathy.

EXAM:
LUMBAR SPINE - 2-3 VIEW

[l-spine ap]
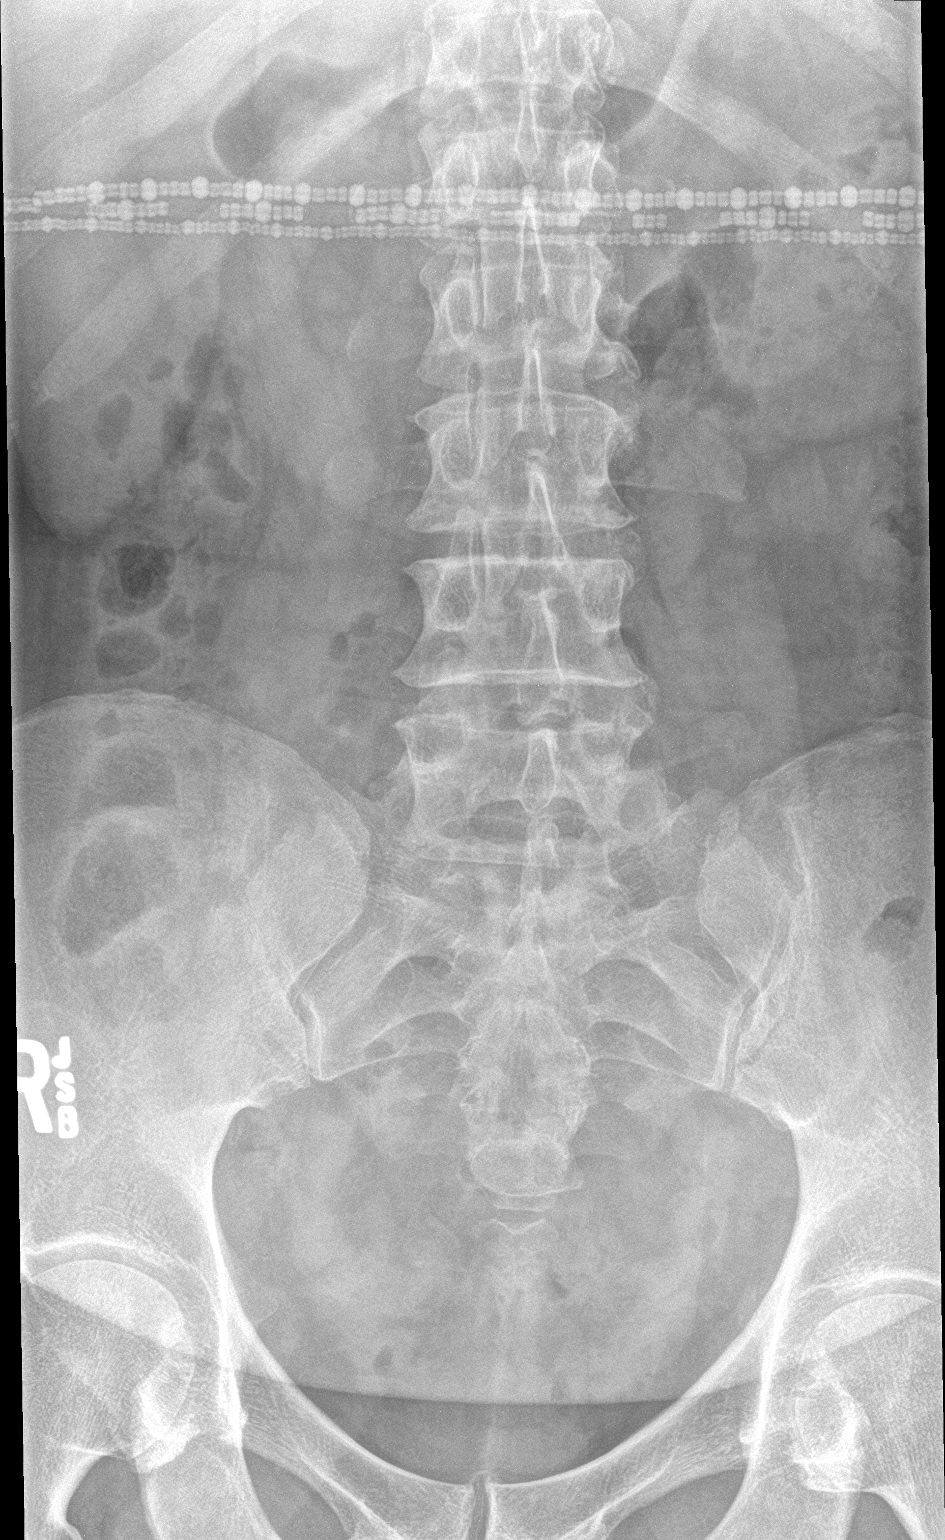

[l-spine lat]
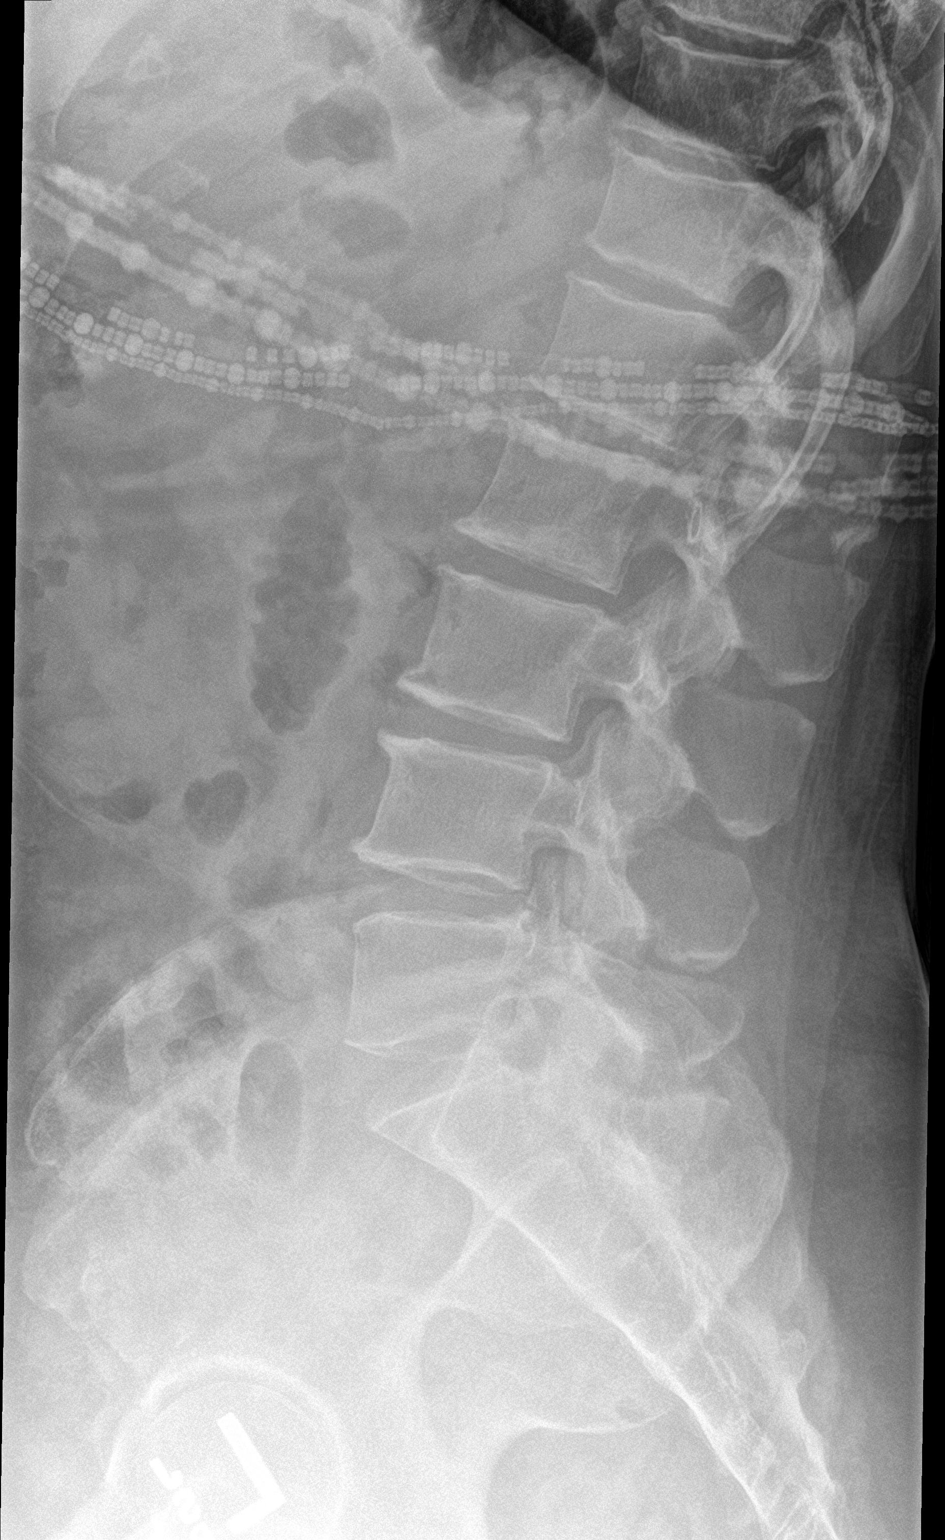

[l-spine spot]
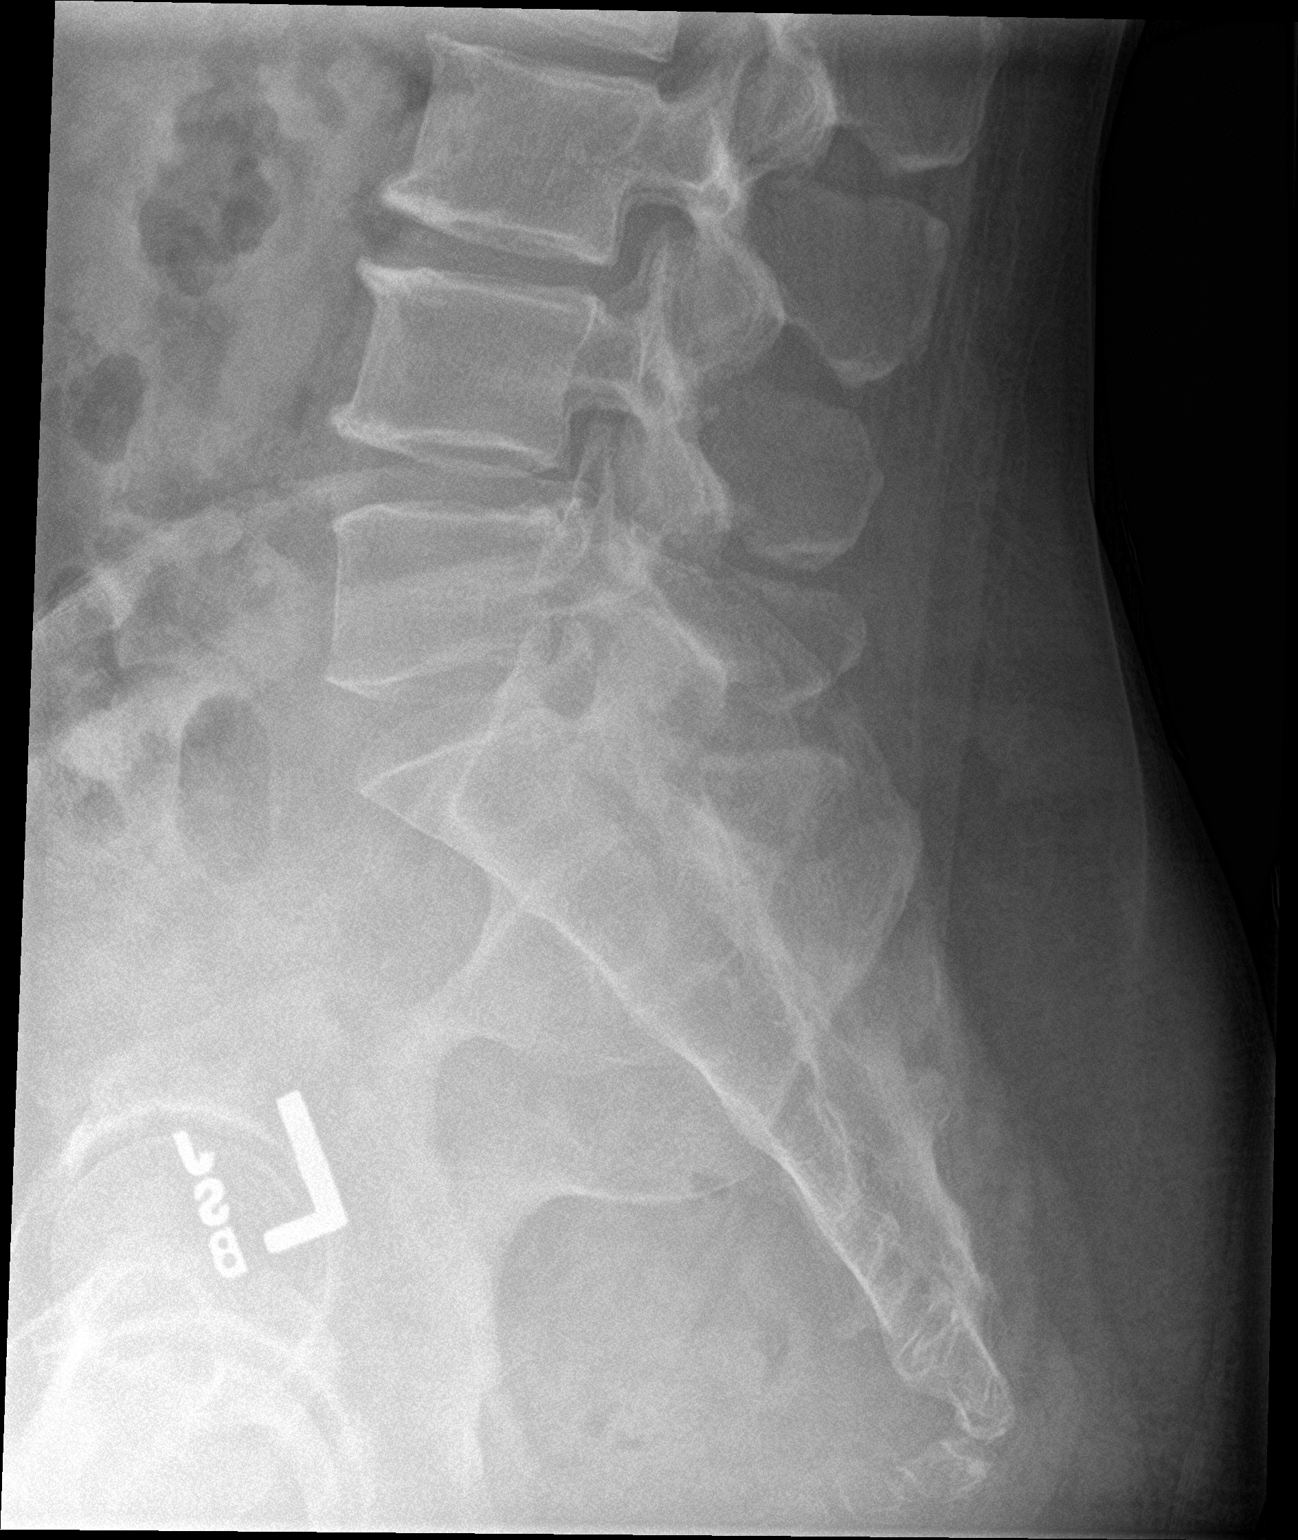

[3 of 3 positions shown; findings below may reference images not displayed]

FINDINGS: There is no evidence of lumbar spine fracture. Alignment is normal.
Moderate to marked severity, predominately anterior endplate
sclerosis is seen at the levels of L1-L2, L2-L3, L3-L4 and L4-L5.
Mild intervertebral disc space narrowing is seen at L1-L2.
IMPRESSION: Moderate to marked severity multilevel degenerative changes.

## 2023-11-13 ENCOUNTER — Other Ambulatory Visit: Payer: Self-pay

## 2023-11-13 ENCOUNTER — Emergency Department (HOSPITAL_COMMUNITY)
Admission: EM | Admit: 2023-11-13 | Discharge: 2023-11-13 | Disposition: A | Discharge status: Home / Self Care | Attending: Emergency Medicine | Admitting: Emergency Medicine

## 2023-11-13 ENCOUNTER — Encounter (HOSPITAL_COMMUNITY): Payer: Self-pay | Admitting: Emergency Medicine

## 2023-11-13 DIAGNOSIS — M79662 Pain in left lower leg: Secondary | ICD-10-CM | POA: Insufficient documentation

## 2023-11-13 DIAGNOSIS — M79605 Pain in left leg: Secondary | ICD-10-CM | POA: Diagnosis present

## 2023-11-13 DIAGNOSIS — Z7984 Long term (current) use of oral hypoglycemic drugs: Secondary | ICD-10-CM | POA: Diagnosis not present

## 2023-11-13 DIAGNOSIS — E1165 Type 2 diabetes mellitus with hyperglycemia: Secondary | ICD-10-CM | POA: Insufficient documentation

## 2023-11-13 LAB — COMPREHENSIVE METABOLIC PANEL WITH GFR
ALT: 27 U/L (ref 0–44)
AST: 17 U/L (ref 15–41)
Albumin: 4.4 g/dL (ref 3.5–5.0)
Alkaline Phosphatase: 112 U/L (ref 38–126)
Anion gap: 12 (ref 5–15)
BUN: 15 mg/dL (ref 6–20)
CO2: 25 mmol/L (ref 22–32)
Calcium: 9.3 mg/dL (ref 8.9–10.3)
Chloride: 100 mmol/L (ref 98–111)
Creatinine, Ser: 1.12 mg/dL — ABNORMAL HIGH (ref 0.44–1.00)
GFR, Estimated: 56 mL/min — ABNORMAL LOW (ref >60)
Glucose, Bld: 551 mg/dL (ref 70–99)
Potassium: 4.5 mmol/L (ref 3.5–5.1)
Sodium: 136 mmol/L (ref 135–145)
Total Bilirubin: 0.3 mg/dL (ref 0.0–1.2)
Total Protein: 6.7 g/dL (ref 6.5–8.1)

## 2023-11-13 LAB — CBG MONITORING, ED
Glucose-Capillary: 318 mg/dL — ABNORMAL HIGH (ref 70–99)
Glucose-Capillary: 293 mg/dL — ABNORMAL HIGH (ref 70–99)

## 2023-11-13 LAB — CBC WITH DIFFERENTIAL/PLATELET
Abs Immature Granulocytes: 0 K/uL (ref 0.00–0.07)
Basophils Absolute: 0 K/uL (ref 0.0–0.1)
Basophils Relative: 0 %
Eosinophils Absolute: 0.1 K/uL (ref 0.0–0.5)
Eosinophils Relative: 2 %
HCT: 40.2 % (ref 36.0–46.0)
Hemoglobin: 12.5 g/dL (ref 12.0–15.0)
Immature Granulocytes: 0 %
Lymphocytes Relative: 38 %
Lymphs Abs: 1.8 K/uL (ref 0.7–4.0)
MCH: 26.9 pg (ref 26.0–34.0)
MCHC: 31.1 g/dL (ref 30.0–36.0)
MCV: 86.5 fL (ref 80.0–100.0)
Monocytes Absolute: 0.3 K/uL (ref 0.1–1.0)
Monocytes Relative: 6 %
Neutro Abs: 2.5 K/uL (ref 1.7–7.7)
Neutrophils Relative %: 54 %
Platelets: 277 K/uL (ref 150–400)
RBC: 4.65 MIL/uL (ref 3.87–5.11)
RDW: 11.6 % (ref 11.5–15.5)
WBC: 4.7 K/uL (ref 4.0–10.5)
nRBC: 0 % (ref 0.0–0.2)

## 2023-11-13 LAB — D-DIMER, QUANTITATIVE: D-Dimer, Quant: 0.43 ug{FEU}/mL (ref 0.00–0.50)

## 2023-11-13 LAB — MAGNESIUM: Magnesium: 2 mg/dL (ref 1.7–2.4)

## 2023-11-13 MED ORDER — INSULIN ASPART 100 UNIT/ML IJ SOLN
5.0000 [IU] | Freq: Once | INTRAMUSCULAR | Status: AC
  Administered 2023-11-13: 5 [IU] via SUBCUTANEOUS
  Filled 2023-11-13: qty 1

## 2023-11-13 MED ORDER — SODIUM CHLORIDE 0.9 % IV BOLUS: 1000.0000 mL | Freq: Once | INTRAVENOUS | Status: DC

## 2023-11-13 MED ORDER — LACTATED RINGERS IV BOLUS
1000.0000 mL | Freq: Once | INTRAVENOUS | Status: AC
  Administered 2023-11-13: 1000 mL via INTRAVENOUS

## 2023-11-13 NOTE — Discharge Instructions (Addendum)
 You were seen today for a knot in your left leg, fortunately there is no sign of a blood clot.  Your blood sugar is very high, however.  You are given fluids and insulin for this.  You need to follow-up in the next few days with your PCP to discuss managing your diabetes.

## 2023-11-13 NOTE — ED Triage Notes (Signed)
 Pt c/o bilateral leg pain x several days, pain to left thigh starting today with soreness and a knot

## 2023-11-13 NOTE — ED Provider Notes (Cosign Needed Addendum)
  Physical Exam  BP (!) 146/87 (BP Location: Right Arm)   Pulse 75   Temp 98.3 F (36.8 C)   Resp 20   Ht 5' 6 (1.676 m)   Wt 72.6 kg   SpO2 100%   BMI 25.82 kg/m   Physical Exam Constitutional:      Appearance: Normal appearance.  HENT:     Mouth/Throat:     Mouth: Mucous membranes are moist.  Neurological:     Mental Status: She is alert.     Procedures  Procedures  ED Course / MDM    Medical Decision Making Amount and/or Complexity of Data Reviewed Labs: ordered.  Risk Prescription drug management.   This patient's care was assumed by me at shift change. The tests pending are check glucose. Plan is for discharge after results.   Patient was re-evaluated by me as well. I discussed their result with them and plan is for discharge home, glucose after IV fluids and insulin is 293, patient is not in DKA, she has no complaints.  She was primarily here for leg pain with concern of possible DVT, dimer is negative, no secondary signs of DVT open patient subjectively feeling a knot in her leg.  In the process of workup she got basic labs and had glucose of 551.  She denies eating or drinking high glucose foods, denies recent illness, denies recent steroid use to cause her high blood sugar.  Her last A1c in 2024 was 5.8. reports she is on metformin but no other medications, advised on close outpatient follow-up with her PCP for diabetes management.  She is agreeable to plan of care and discharge.       Sara Sherran LABOR, Sara Washington 11/13/23 2201    Sara Sherran LABOR, Sara Washington 11/13/23 2202    Sara Lot, MD 11/14/23 [phone]

## 2023-11-13 NOTE — ED Provider Notes (Signed)
 [place] EMERGENCY DEPARTMENT AT [facility] Provider Note   CSN: [id number] Arrival date & time: 11/13/23  1508     Patient presents with: Leg Pain   Sara Washington is a 58 y.o. female presents to the ED with pain in her left thigh that began last night when she was in bed.  The symptoms started as a sudden onset of cramping pain in bilateral legs, and states that the cramps have eased, however now she feels like she has a knot in her left leg. Patient states she has a history of leg cramps at night, however has not ever felt an associated knot after leg cramping. The patient reports there is some slight tingling and pain to the left leg and in the right leg she has no complaints.  She denies numbness, decreased ROM, or difficulty ambulating.  Patient denies trying anything for the pain. Patient denies any history of blood clots, no recent travel, no recent surgeries, no recent immobilizations.  Denies chest pain or shortness of breath.  The patient's social history is notable for non-smoker.      Leg Pain      Prior to Admission medications   Medication Sig Start Date End Date Taking? Authorizing Provider  Diclofenac -miSOPROStol  50-0.2 MG TBEC Take 1 tablet by mouth 2 (two) times daily. 12/01/19   Onesimo Oneil LABOR, MD  gabapentin (NEURONTIN) 100 MG capsule TAKE 1 CAPSULE BY MOUTH TWICE DAILY FOR PAIN    [provider]  ibuprofen  (ADVIL ,MOTRIN ) 600 MG tablet Take 1 tablet (600 mg total) by mouth 3 (three) times daily with meals as needed. 03/13/17   Carlyle Lenis, MD  meloxicam (MOBIC) 7.5 MG tablet TAKE 1 TABLET BY MOUTH EVERY DAY AS NEEDED FOR PAIN    [provider]  methocarbamol  (ROBAXIN ) 500 MG tablet Take 500 mg by mouth every 8 (eight) hours as needed. 11/23/19   [provider]  naproxen  sodium (ALEVE ) 220 MG tablet Take 660 mg by mouth as needed.    [provider]  omeprazole (PRILOSEC) 10 MG capsule TAKE 1 CAPSULE BY MOUTH  EVERY DAY FOR REFLUX    [provider]  pantoprazole  (PROTONIX ) 40 MG tablet Take 1 tablet (40 mg total) by mouth daily. Take 30 minutes before breakfast 10/15/17   Shirlean Therisa ORN, NP  propranolol (INDERAL) 40 MG tablet TAKE 1 TABLET BY MOUTH TWICE DAILY EVERY DAY FOR MIGRAINES    [provider]  rizatriptan (MAXALT) 10 MG tablet TAKE 1 TABLET BY MOUTH AT EARLY ONSET OF MIGRAINE HEADACHE AS NEEDED    [provider]  rOPINIRole (REQUIP) 0.25 MG tablet Take 1 tablet by mouth 2 (two) times daily.    [provider]  SUMAtriptan (IMITREX) 20 MG/ACT nasal spray 1 spray at onset of headache in one nostril on opposite side of the headaceh may repeat after 2 hours as needed Nasally Once a day for 30 days 03/09/19   [provider]    Allergies: Patient has no known allergies.    Review of Systems  Musculoskeletal:        Left leg pain    Updated Vital Signs BP (!) 146/87 (BP Location: Right Arm)   Pulse 75   Temp 98.3 F (36.8 C)   Resp 20   Ht 5' 6 (1.676 m)   Wt 72.6 kg   SpO2 100%   BMI 25.82 kg/m   Physical Exam Vitals and nursing note reviewed.  Constitutional:  General: She is not in acute distress.    Appearance: Normal appearance.  HENT:     Head: Normocephalic and atraumatic.  Eyes:     Extraocular Movements: Extraocular movements intact.     Conjunctiva/sclera: Conjunctivae normal.     Pupils: Pupils are equal, round, and reactive to light.  Cardiovascular:     Rate and Rhythm: Normal rate and regular rhythm.     Pulses: Normal pulses.          Radial pulses are 2+ on the right side.       Dorsalis pedis pulses are 2+ on the right side and 2+ on the left side.  Pulmonary:     Effort: Pulmonary effort is normal. No respiratory distress.  Abdominal:     General: Abdomen is flat.     Palpations: Abdomen is soft.     Tenderness: There is no abdominal tenderness.  Musculoskeletal:        General: Normal range of motion.      Cervical back: Normal range of motion.     Right lower leg: No edema.     Left lower leg: No edema.     Comments: Patient complains of no pain to palpation to left and right lower extremity.  Area was palpated where patient feels the knot and no mass was palpated.  There is no edema, rashes, obvious trauma to bilateral lower extremities.  ROM intact.  ROM without pain.  Skin:    General: Skin is warm and dry.     Capillary Refill: Capillary refill takes less than 2 seconds.  Neurological:     General: No focal deficit present.     Mental Status: She is alert. Mental status is at baseline.  Psychiatric:        Mood and Affect: Mood normal.     (all labs ordered are listed, but only abnormal results are displayed) Labs Reviewed  COMPREHENSIVE METABOLIC PANEL WITH GFR - Abnormal; Notable for the following components:      Result Value   Glucose, Bld 551 (*)    Creatinine, Ser 1.12 (*)    GFR, Estimated 56 (*)    All other components within normal limits  CBG MONITORING, ED - Abnormal; Notable for the following components:   Glucose-Capillary 318 (*)    All other components within normal limits  CBC WITH DIFFERENTIAL/PLATELET  MAGNESIUM  D-DIMER, QUANTITATIVE    EKG: None  Radiology: No results found.   Procedures   Medications Ordered in the ED  lactated ringers bolus 1,000 mL (1,000 mLs Intravenous New Bag/Given 11/13/23 1953)  insulin aspart (novoLOG) injection 5 Units (5 Units Subcutaneous Given 11/13/23 1954)                                    Medical Decision Making Amount and/or Complexity of Data Reviewed Labs: ordered.   Patient presents to the ED for concern of left leg pain, this involves an extensive number of treatment options, and is a complaint that carries with it a high risk of complications and morbidity.    The differential diagnosis includes: DVT MSK Neurological  Co morbidities that complicate the patient  evaluation: Obesity Diabetes  Additional history obtained: Additional history obtained from Outside Medical Records and Past Admission  External records from outside source obtained and reviewed including medical history, surgical history, allergies, medications. The patient was a reliable historian, providing a clear,  detailed, and consistent account of the presenting symptoms and relevant medical history. The information was obtained directly from the patient and statements were documented in the patient's own words when possible. No discrepancies were noted between the history provided and available collateral sources.     Lab Tests: I ordered, and personally interpreted labs.   The pertinent results include:  CBC WNL CMP - glucose 551, creatinine 1.12 D-dimer 0.43 Magnesium WNL  Medicines ordered and prescription drug management: I ordered medications: Lactated ringer bolus 1000 mL for glycemia I have reviewed the patients home medicines and have made adjustments as needed  Test Considered: No other diagnostic testing was considered based on the patient's presenting symptoms, risk factors, and initial clinical assessment.  The approach to diagnostic testing prioritized exclusion of life-threatening conditions  Problem List / ED Course: Problem List: Left leg pain  Hyperglycemia  Emergency Department Course: The patient presented with left leg pain. Initial assessment included history, physical exam, and review of prior medical records.  Initial testing showed a D-dimer of 0.43, making DVT unlikely source of patient's pain.  CMP did show that patient's glucose was 551 and creatinine was elevated at 1.12, lactated ringer bolus initiated for hyperglycemia, which could be a causative factor in patient's lower extremity cramping.  At this time, patient care was turned over to PA [place] at shift turnover.   Dispostion: After consideration of the diagnostic results and the patients  response to treatment, I feel that the patent would benefit from discharge home, pending patient blood glucose being lowered, with supportive care measures and close follow-up with PCP regarding lower extremity pain and uncontrolled hyperglycemia.     Final diagnoses:  Hyperglycemia due to diabetes mellitus [facility])  Pain of left lower extremity    ED Discharge Orders     None          Willma Duwaine CROME, [place] 11/13/23 2129    Towana Ozell BROCKS, MD 11/14/23 [phone]

## 2023-11-15 ENCOUNTER — Other Ambulatory Visit: Payer: Self-pay

## 2023-11-15 ENCOUNTER — Emergency Department (HOSPITAL_COMMUNITY)
Admission: EM | Admit: 2023-11-15 | Discharge: 2023-11-15 | Disposition: A | Discharge status: Home / Self Care | Attending: Emergency Medicine | Admitting: Emergency Medicine

## 2023-11-15 ENCOUNTER — Encounter (HOSPITAL_COMMUNITY): Payer: Self-pay | Admitting: *Deleted

## 2023-11-15 DIAGNOSIS — R739 Hyperglycemia, unspecified: Secondary | ICD-10-CM

## 2023-11-15 DIAGNOSIS — E1165 Type 2 diabetes mellitus with hyperglycemia: Secondary | ICD-10-CM | POA: Insufficient documentation

## 2023-11-15 LAB — URINALYSIS, ROUTINE W REFLEX MICROSCOPIC
Bacteria, UA: NONE SEEN
Bilirubin Urine: NEGATIVE
Glucose, UA: >=500 mg/dL — AB
Ketones, ur: NEGATIVE mg/dL
Leukocytes,Ua: NEGATIVE
Nitrite: NEGATIVE
Protein, ur: NEGATIVE mg/dL
Specific Gravity, Urine: 1.036 — ABNORMAL HIGH (ref 1.005–1.030)
pH: 5 (ref 5.0–8.0)

## 2023-11-15 LAB — CBC WITH DIFFERENTIAL/PLATELET
Abs Immature Granulocytes: 0.01 K/uL (ref 0.00–0.07)
Basophils Absolute: 0 K/uL (ref 0.0–0.1)
Basophils Relative: 0 %
Eosinophils Absolute: 0 K/uL (ref 0.0–0.5)
Eosinophils Relative: 1 %
HCT: 40.2 % (ref 36.0–46.0)
Hemoglobin: 12.9 g/dL (ref 12.0–15.0)
Immature Granulocytes: 0 %
Lymphocytes Relative: 32 %
Lymphs Abs: 1.8 K/uL (ref 0.7–4.0)
MCH: 27.2 pg (ref 26.0–34.0)
MCHC: 32.1 g/dL (ref 30.0–36.0)
MCV: 84.6 fL (ref 80.0–100.0)
Monocytes Absolute: 0.4 K/uL (ref 0.1–1.0)
Monocytes Relative: 7 %
Neutro Abs: 3.3 K/uL (ref 1.7–7.7)
Neutrophils Relative %: 60 %
Platelets: 286 K/uL (ref 150–400)
RBC: 4.75 MIL/uL (ref 3.87–5.11)
RDW: 11.6 % (ref 11.5–15.5)
WBC: 5.4 K/uL (ref 4.0–10.5)
nRBC: 0 % (ref 0.0–0.2)

## 2023-11-15 LAB — CBG MONITORING, ED
Glucose-Capillary: 469 mg/dL — ABNORMAL HIGH (ref 70–99)
Glucose-Capillary: 304 mg/dL — ABNORMAL HIGH (ref 70–99)

## 2023-11-15 LAB — BLOOD GAS, VENOUS
Acid-base deficit: 1.7 mmol/L (ref 0.0–2.0)
Bicarbonate: 23 mmol/L (ref 20.0–28.0)
Drawn by: 69769
O2 Saturation: 90.2 %
Patient temperature: 36.7
pCO2, Ven: 37 mmHg — ABNORMAL LOW (ref 44–60)
pH, Ven: 7.39 (ref 7.25–7.43)
pO2, Ven: 60 mmHg — ABNORMAL HIGH (ref 32–45)

## 2023-11-15 LAB — BASIC METABOLIC PANEL WITH GFR
Anion gap: 12 (ref 5–15)
BUN: 16 mg/dL (ref 6–20)
CO2: 23 mmol/L (ref 22–32)
Calcium: 9.6 mg/dL (ref 8.9–10.3)
Chloride: 100 mmol/L (ref 98–111)
Creatinine, Ser: 1.04 mg/dL — ABNORMAL HIGH (ref 0.44–1.00)
GFR, Estimated: >60 mL/min (ref >60)
Glucose, Bld: 504 mg/dL (ref 70–99)
Potassium: 3.6 mmol/L (ref 3.5–5.1)
Sodium: 135 mmol/L (ref 135–145)

## 2023-11-15 LAB — BETA-HYDROXYBUTYRIC ACID: Beta-Hydroxybutyric Acid: 0.09 mmol/L (ref 0.05–0.27)

## 2023-11-15 MED ORDER — POTASSIUM CHLORIDE CRYS ER 20 MEQ PO TBCR
40.0000 meq | EXTENDED_RELEASE_TABLET | Freq: Once | ORAL | Status: AC
  Administered 2023-11-15: 40 meq via ORAL
  Filled 2023-11-15: qty 2

## 2023-11-15 MED ORDER — BLOOD GLUCOSE MONITORING SUPPL DEVI: 1.0000 | 0 refills | Status: AC

## 2023-11-15 MED ORDER — LANCETS MISC: 1.0000 | 0 refills | Status: AC

## 2023-11-15 MED ORDER — LACTATED RINGERS IV BOLUS
1000.0000 mL | INTRAVENOUS | Status: AC
  Administered 2023-11-15 (×2): 1000 mL via INTRAVENOUS

## 2023-11-15 MED ORDER — LANCET DEVICE MISC: 1.0000 | 0 refills | Status: AC

## 2023-11-15 MED ORDER — BLOOD GLUCOSE TEST VI STRP: 1.0000 | ORAL_STRIP | 0 refills | Status: AC

## 2023-11-15 NOTE — Discharge Instructions (Signed)
 As we discussed, your workup in the ER today was overall reassuring for acute findings.  Your blood sugar was high, however it did improve with IV hydration.  I do recommend that you increase your dose of metformin from 500 mg once daily to 500 mg twice daily.  I have given you a prescription for supplies that you can check your blood sugars at home.  Please call your PCP on Monday to schedule a close follow-up appointment to discuss additional medication management of your blood sugars.  In the interim, if you are noticing that your sugar readings are elevated, please be sure that you are drinking plenty of water . You can also go for a 30 minute walk which will help lower your sugar as well.   Return if development of any new or worsening symptoms.

## 2023-11-15 NOTE — ED Provider Notes (Signed)
 [place] EMERGENCY DEPARTMENT AT [facility] Provider Note   CSN: [id number] Arrival date & time: 11/15/23  1357     Patient presents with: Hyperglycemia   Sara Washington is a 58 y.o. female.   Patient with history of diabetes presents today with complaints of hyperglycemia. Reports that she was seen 2 days ago for an unrelated issue and was told that her blood sugar was high. She was discharged and told to make dietary changes and follow-up with her pcp on Monday. Reports that the issue she was seen for 2 days ago has since resolved and she feels completely normal, however she had a friend over today who has a glucometer and test strips and checked her sugar and noticed it was 500. Presents for same. Reports she takes 500 mg metformin once a day and started this on 9/22 when she was diagnosed with diabetes. She is unsure what her A1c was.   The history is provided by the patient. No language interpreter was used.  Hyperglycemia      Prior to Admission medications   Medication Sig Start Date End Date Taking? Authorizing Provider  Diclofenac -miSOPROStol  50-0.2 MG TBEC Take 1 tablet by mouth 2 (two) times daily. 12/01/19   Onesimo Oneil LABOR, MD  gabapentin (NEURONTIN) 100 MG capsule TAKE 1 CAPSULE BY MOUTH TWICE DAILY FOR PAIN    [provider]  ibuprofen  (ADVIL ,MOTRIN ) 600 MG tablet Take 1 tablet (600 mg total) by mouth 3 (three) times daily with meals as needed. 03/13/17   Carlyle Lenis, MD  meloxicam (MOBIC) 7.5 MG tablet TAKE 1 TABLET BY MOUTH EVERY DAY AS NEEDED FOR PAIN    [provider]  methocarbamol  (ROBAXIN ) 500 MG tablet Take 500 mg by mouth every 8 (eight) hours as needed. 11/23/19   [provider]  naproxen  sodium (ALEVE ) 220 MG tablet Take 660 mg by mouth as needed.    [provider]  omeprazole (PRILOSEC) 10 MG capsule TAKE 1 CAPSULE BY MOUTH EVERY DAY FOR REFLUX    [provider]  pantoprazole  (PROTONIX ) 40 MG  tablet Take 1 tablet (40 mg total) by mouth daily. Take 30 minutes before breakfast 10/15/17   Shirlean Therisa ORN, NP  propranolol (INDERAL) 40 MG tablet TAKE 1 TABLET BY MOUTH TWICE DAILY EVERY DAY FOR MIGRAINES    [provider]  rizatriptan (MAXALT) 10 MG tablet TAKE 1 TABLET BY MOUTH AT EARLY ONSET OF MIGRAINE HEADACHE AS NEEDED    [provider]  rOPINIRole (REQUIP) 0.25 MG tablet Take 1 tablet by mouth 2 (two) times daily.    [provider]  SUMAtriptan (IMITREX) 20 MG/ACT nasal spray 1 spray at onset of headache in one nostril on opposite side of the headaceh may repeat after 2 hours as needed Nasally Once a day for 30 days 03/09/19   [provider]    Allergies: Patient has no known allergies.    Review of Systems  All other systems reviewed and are negative.   Updated Vital Signs BP 128/82 (BP Location: Right Arm)   Pulse (!) 102   Temp 98 F (36.7 C)   Resp 16   Ht 5' 6 (1.676 m)   Wt 72.6 kg   SpO2 97%   BMI 25.83 kg/m   Physical Exam Vitals and nursing note reviewed.  Constitutional:      General: She is not in acute distress.    Appearance: Normal appearance. She is normal weight. She is not ill-appearing,  toxic-appearing or diaphoretic.  HENT:     Head: Normocephalic and atraumatic.  Cardiovascular:     Rate and Rhythm: Normal rate.  Pulmonary:     Effort: Pulmonary effort is normal. No respiratory distress.  Abdominal:     General: Abdomen is flat.     Palpations: Abdomen is soft.     Tenderness: There is no abdominal tenderness.  Musculoskeletal:        General: Normal range of motion.     Cervical back: Normal range of motion.  Skin:    General: Skin is warm and dry.  Neurological:     General: No focal deficit present.     Mental Status: She is alert.  Psychiatric:        Mood and Affect: Mood normal.        Behavior: Behavior normal.     (all labs ordered are listed, but only abnormal results are  displayed) Labs Reviewed  BASIC METABOLIC PANEL WITH GFR - Abnormal; Notable for the following components:      Result Value   Glucose, Bld 504 (*)    Creatinine, Ser 1.04 (*)    All other components within normal limits  URINALYSIS, ROUTINE W REFLEX MICROSCOPIC - Abnormal; Notable for the following components:   Specific Gravity, Urine 1.036 (*)    Glucose, UA >=500 (*)    Hgb urine dipstick SMALL (*)    All other components within normal limits  BLOOD GAS, VENOUS - Abnormal; Notable for the following components:   pCO2, Ven 37 (*)    pO2, Ven 60 (*)    All other components within normal limits  CBG MONITORING, ED - Abnormal; Notable for the following components:   Glucose-Capillary 469 (*)    All other components within normal limits  CBG MONITORING, ED - Abnormal; Notable for the following components:   Glucose-Capillary 304 (*)    All other components within normal limits  CBC WITH DIFFERENTIAL/PLATELET  BETA-HYDROXYBUTYRIC ACID    EKG: None  Radiology: No results found.   Procedures   Medications Ordered in the ED  lactated ringers bolus 1,000 mL (0 mLs Intravenous Stopped 11/15/23 1631)  potassium chloride SA (KLOR-CON M) CR tablet 40 mEq (40 mEq Oral Given 11/15/23 1642)                                    Medical Decision Making Amount and/or Complexity of Data Reviewed Labs: ordered.  Risk Prescription drug management.   This patient is a 58 y.o. female who presents to the ED for concern of hyperglycemia, this involves an extensive number of treatment options, and is a complaint that carries with it a high risk of complications and morbidity. The emergent differential diagnosis prior to evaluation includes, but is not limited to,  DKA, HHS, sepsis . This is not an exhaustive differential.   Past Medical History / Co-morbidities / Social History:  has a past medical history of Carpal tunnel syndrome, GERD (gastroesophageal reflux disease), and  Migraine.  Additional history: Chart reviewed. Pertinent results include: seen 2 days ago, found to have hyperglycemia in the 500s, no signs of DKA/HHS, discharged after fluids and insulin.  Physical Exam: Physical exam performed. The pertinent findings include: well appearing, no acute physical exam abnormalities  Lab Tests: I ordered, and personally interpreted labs.  The pertinent results include:  glucose 504 --> 304. K 3.6. Bicarb and anion gap  WNL. UA without ketones   Medications: I ordered medication including IV fluids  for hyperglycemia. Reevaluation of the patient after these medicines showed that the patient improved. I have reviewed the patients home medicines and have made adjustments as needed.  Disposition: After consideration of the diagnostic results and the patients response to treatment, I feel that emergency department workup does not suggest an emergent condition requiring admission or immediate intervention beyond what has been performed at this time. The plan is: discharge with close outpatient follow-up and return precautions.  Above interventions, patient's blood sugar has improved.  She has no signs or symptoms or laboratory abnormalities to suggest DKA/HHS.  She feels well and ready to go home.  She does take 500 mg of metformin once daily, recommend that she increase this to twice daily and call her PCP on Monday to schedule appointment.  Will give her a glucometer and test strips so she can monitor her sugars at home. Evaluation and diagnostic testing in the emergency department does not suggest an emergent condition requiring admission or immediate intervention beyond what has been performed at this time.  Plan for discharge with close PCP follow-up.  Patient is understanding and amenable with plan, educated on red flag symptoms that would prompt immediate return.  Patient discharged in stable condition.  Final diagnoses:  Hyperglycemia    ED Discharge Orders           Ordered    Blood Glucose Monitoring Suppl DEVI  As directed        11/15/23 1752    Glucose Blood (BLOOD GLUCOSE TEST STRIPS) STRP  As directed        11/15/23 1752    Lancet Device MISC  As directed        11/15/23 1752    Lancets MISC  As directed        11/15/23 1752          An After Visit Summary was printed and given to the patient.      Nora Lauraine DELENA DEVONNA 11/15/23 1756    Melvenia Motto, MD 11/16/23 [phone]

## 2023-11-15 NOTE — ED Triage Notes (Signed)
 Pt with elevated blood sugar in 500's today. Pt is taking oral medication-Metformin for her diabetes. Denies any pain.

## 2023-12-15 ENCOUNTER — Other Ambulatory Visit (HOSPITAL_COMMUNITY): Payer: Self-pay | Admitting: Adult Health

## 2023-12-15 DIAGNOSIS — Z1231 Encounter for screening mammogram for malignant neoplasm of breast: Secondary | ICD-10-CM

## 2024-01-12 ENCOUNTER — Ambulatory Visit (HOSPITAL_COMMUNITY)

## 2024-03-23 ENCOUNTER — Ambulatory Visit: Payer: Self-pay | Admitting: Nurse Practitioner
